# Patient Record
Sex: Female | Born: 1975 | Race: White | Hispanic: No | Smoking: Former smoker
Health system: Southern US, Community
[De-identification: ages and names within clinical notes are randomized; demographics above are authoritative.]

## PROBLEM LIST (undated history)

## (undated) DIAGNOSIS — F319 Bipolar disorder, unspecified: Secondary | ICD-10-CM

## (undated) DIAGNOSIS — N858 Other specified noninflammatory disorders of uterus: Secondary | ICD-10-CM

## (undated) DIAGNOSIS — F32A Depression, unspecified: Secondary | ICD-10-CM

## (undated) DIAGNOSIS — F329 Major depressive disorder, single episode, unspecified: Secondary | ICD-10-CM

## (undated) DIAGNOSIS — C50911 Malignant neoplasm of unspecified site of right female breast: Secondary | ICD-10-CM

## (undated) DIAGNOSIS — C8590 Non-Hodgkin lymphoma, unspecified, unspecified site: Secondary | ICD-10-CM

## (undated) DIAGNOSIS — M545 Low back pain, unspecified: Secondary | ICD-10-CM

## (undated) DIAGNOSIS — G8929 Other chronic pain: Secondary | ICD-10-CM

## (undated) DIAGNOSIS — F419 Anxiety disorder, unspecified: Secondary | ICD-10-CM

## (undated) DIAGNOSIS — K859 Acute pancreatitis without necrosis or infection, unspecified: Secondary | ICD-10-CM

## (undated) DIAGNOSIS — C50912 Malignant neoplasm of unspecified site of left female breast: Secondary | ICD-10-CM

## (undated) DIAGNOSIS — G43909 Migraine, unspecified, not intractable, without status migrainosus: Secondary | ICD-10-CM

## (undated) HISTORY — PX: BREAST BIOPSY: SHX20

## (undated) HISTORY — PX: MASTECTOMY PARTIAL / LUMPECTOMY: SUR851

## (undated) HISTORY — PX: BACK SURGERY: SHX140

## (undated) HISTORY — PX: KNEE ARTHROSCOPY: SHX127

---
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
## 1997-08-30 DIAGNOSIS — N858 Other specified noninflammatory disorders of uterus: Secondary | ICD-10-CM

## 1997-08-30 HISTORY — PX: LAPAROSCOPIC ASSISTED VAGINAL HYSTERECTOMY: SHX5398

## 1997-08-30 HISTORY — DX: Other specified noninflammatory disorders of uterus: N85.8

## 2000-08-30 HISTORY — PX: POSTERIOR LUMBAR FUSION: SHX6036

## 2011-08-31 HISTORY — PX: LAPAROSCOPIC CHOLECYSTECTOMY: SUR755

## 2013-08-30 DIAGNOSIS — C8590 Non-Hodgkin lymphoma, unspecified, unspecified site: Secondary | ICD-10-CM

## 2013-08-30 HISTORY — PX: PORTACATH PLACEMENT: SHX2246

## 2013-08-30 HISTORY — DX: Non-Hodgkin lymphoma, unspecified, unspecified site: C85.90

## 2016-08-17 ENCOUNTER — Emergency Department (HOSPITAL_COMMUNITY): Payer: Medicare Other

## 2016-08-17 ENCOUNTER — Encounter (HOSPITAL_COMMUNITY): Payer: Self-pay

## 2016-08-17 ENCOUNTER — Inpatient Hospital Stay (HOSPITAL_COMMUNITY)
Admission: EM | Admit: 2016-08-17 | Discharge: 2016-08-25 | DRG: 093 | Disposition: A | Discharge status: Skilled Nursing Facility | Payer: Medicare Other | Attending: Internal Medicine | Admitting: Internal Medicine

## 2016-08-17 DIAGNOSIS — R4182 Altered mental status, unspecified: Secondary | ICD-10-CM

## 2016-08-17 DIAGNOSIS — Z9221 Personal history of antineoplastic chemotherapy: Secondary | ICD-10-CM

## 2016-08-17 DIAGNOSIS — Z87891 Personal history of nicotine dependence: Secondary | ICD-10-CM

## 2016-08-17 DIAGNOSIS — D649 Anemia, unspecified: Secondary | ICD-10-CM | POA: Diagnosis not present

## 2016-08-17 DIAGNOSIS — Z808 Family history of malignant neoplasm of other organs or systems: Secondary | ICD-10-CM

## 2016-08-17 DIAGNOSIS — G9340 Encephalopathy, unspecified: Secondary | ICD-10-CM | POA: Diagnosis not present

## 2016-08-17 DIAGNOSIS — R109 Unspecified abdominal pain: Secondary | ICD-10-CM

## 2016-08-17 DIAGNOSIS — T502X5A Adverse effect of carbonic-anhydrase inhibitors, benzothiadiazides and other diuretics, initial encounter: Secondary | ICD-10-CM | POA: Diagnosis present

## 2016-08-17 DIAGNOSIS — K219 Gastro-esophageal reflux disease without esophagitis: Secondary | ICD-10-CM

## 2016-08-17 DIAGNOSIS — Z803 Family history of malignant neoplasm of breast: Secondary | ICD-10-CM

## 2016-08-17 DIAGNOSIS — G92 Toxic encephalopathy: Principal | ICD-10-CM | POA: Diagnosis present

## 2016-08-17 DIAGNOSIS — F319 Bipolar disorder, unspecified: Secondary | ICD-10-CM

## 2016-08-17 DIAGNOSIS — F419 Anxiety disorder, unspecified: Secondary | ICD-10-CM

## 2016-08-17 DIAGNOSIS — T428X5A Adverse effect of antiparkinsonism drugs and other central muscle-tone depressants, initial encounter: Secondary | ICD-10-CM | POA: Diagnosis present

## 2016-08-17 DIAGNOSIS — E876 Hypokalemia: Secondary | ICD-10-CM

## 2016-08-17 DIAGNOSIS — Y92811 Bus as the place of occurrence of the external cause: Secondary | ICD-10-CM

## 2016-08-17 DIAGNOSIS — D72829 Elevated white blood cell count, unspecified: Secondary | ICD-10-CM

## 2016-08-17 DIAGNOSIS — T450X5A Adverse effect of antiallergic and antiemetic drugs, initial encounter: Secondary | ICD-10-CM | POA: Diagnosis present

## 2016-08-17 DIAGNOSIS — F4321 Adjustment disorder with depressed mood: Secondary | ICD-10-CM | POA: Diagnosis present

## 2016-08-17 DIAGNOSIS — I10 Essential (primary) hypertension: Secondary | ICD-10-CM | POA: Diagnosis present

## 2016-08-17 DIAGNOSIS — R52 Pain, unspecified: Secondary | ICD-10-CM

## 2016-08-17 DIAGNOSIS — K5903 Drug induced constipation: Secondary | ICD-10-CM | POA: Diagnosis not present

## 2016-08-17 DIAGNOSIS — T426X5A Adverse effect of other antiepileptic and sedative-hypnotic drugs, initial encounter: Secondary | ICD-10-CM | POA: Diagnosis present

## 2016-08-17 DIAGNOSIS — D696 Thrombocytopenia, unspecified: Secondary | ICD-10-CM | POA: Diagnosis not present

## 2016-08-17 DIAGNOSIS — T402X5A Adverse effect of other opioids, initial encounter: Secondary | ICD-10-CM | POA: Diagnosis not present

## 2016-08-17 DIAGNOSIS — C8330 Diffuse large B-cell lymphoma, unspecified site: Secondary | ICD-10-CM

## 2016-08-17 DIAGNOSIS — Z8572 Personal history of non-Hodgkin lymphomas: Secondary | ICD-10-CM

## 2016-08-17 DIAGNOSIS — Z801 Family history of malignant neoplasm of trachea, bronchus and lung: Secondary | ICD-10-CM

## 2016-08-17 HISTORY — DX: Bipolar disorder, unspecified: F31.9

## 2016-08-17 HISTORY — DX: Depression, unspecified: F32.A

## 2016-08-17 HISTORY — DX: Other specified noninflammatory disorders of uterus: N85.8

## 2016-08-17 HISTORY — DX: Malignant neoplasm of unspecified site of left female breast: C50.912

## 2016-08-17 HISTORY — DX: Other chronic pain: G89.29

## 2016-08-17 HISTORY — DX: Low back pain: M54.5

## 2016-08-17 HISTORY — DX: Acute pancreatitis without necrosis or infection, unspecified: K85.90

## 2016-08-17 HISTORY — DX: Major depressive disorder, single episode, unspecified: F32.9

## 2016-08-17 HISTORY — DX: Malignant neoplasm of unspecified site of right female breast: C50.911

## 2016-08-17 HISTORY — DX: Non-Hodgkin lymphoma, unspecified, unspecified site: C85.90

## 2016-08-17 HISTORY — DX: Migraine, unspecified, not intractable, without status migrainosus: G43.909

## 2016-08-17 HISTORY — DX: Low back pain, unspecified: M54.50

## 2016-08-17 HISTORY — DX: Anxiety disorder, unspecified: F41.9

## 2016-08-17 LAB — COMPREHENSIVE METABOLIC PANEL
ALK PHOS: 70 U/L (ref 38–126)
ALT: 29 U/L (ref 14–54)
AST: 31 U/L (ref 15–41)
Albumin: 4.1 g/dL (ref 3.5–5.0)
Anion gap: 3 — ABNORMAL LOW (ref 5–15)
BILIRUBIN TOTAL: 0.5 mg/dL (ref 0.3–1.2)
BUN: 6 mg/dL (ref 6–20)
CALCIUM: 9.4 mg/dL (ref 8.9–10.3)
CO2: 29 mmol/L (ref 22–32)
CREATININE: 0.76 mg/dL (ref 0.44–1.00)
Chloride: 107 mmol/L (ref 101–111)
Glucose, Bld: 117 mg/dL — ABNORMAL HIGH (ref 65–99)
Potassium: 3.7 mmol/L (ref 3.5–5.1)
Sodium: 139 mmol/L (ref 135–145)
TOTAL PROTEIN: 7.6 g/dL (ref 6.5–8.1)

## 2016-08-17 LAB — CBC WITH DIFFERENTIAL/PLATELET
Basophils Absolute: 0 10*3/uL (ref 0.0–0.1)
Basophils Relative: 0 %
Eosinophils Absolute: 0.1 10*3/uL (ref 0.0–0.7)
Eosinophils Relative: 1 %
HEMATOCRIT: 38.6 % (ref 36.0–46.0)
HEMOGLOBIN: 12.9 g/dL (ref 12.0–15.0)
LYMPHS ABS: 2.3 10*3/uL (ref 0.7–4.0)
LYMPHS PCT: 28 %
MCH: 30.9 pg (ref 26.0–34.0)
MCHC: 33.4 g/dL (ref 30.0–36.0)
MCV: 92.3 fL (ref 78.0–100.0)
MONOS PCT: 8 %
Monocytes Absolute: 0.7 10*3/uL (ref 0.1–1.0)
NEUTROS PCT: 63 %
Neutro Abs: 5 10*3/uL (ref 1.7–7.7)
Platelets: 139 10*3/uL — ABNORMAL LOW (ref 150–400)
RBC: 4.18 MIL/uL (ref 3.87–5.11)
RDW: 14.4 % (ref 11.5–15.5)
WBC: 8.1 10*3/uL (ref 4.0–10.5)

## 2016-08-17 LAB — RAPID URINE DRUG SCREEN, HOSP PERFORMED
Amphetamines: NOT DETECTED
BARBITURATES: NOT DETECTED
Benzodiazepines: POSITIVE — AB
Cocaine: NOT DETECTED
OPIATES: NOT DETECTED
Tetrahydrocannabinol: NOT DETECTED

## 2016-08-17 LAB — ETHANOL
Alcohol, Ethyl (B): <5 mg/dL (ref <5)
Alcohol, Ethyl (B): <5 mg/dL (ref <5)

## 2016-08-17 LAB — URINALYSIS, ROUTINE W REFLEX MICROSCOPIC
Bilirubin Urine: NEGATIVE
Glucose, UA: NEGATIVE mg/dL
Hgb urine dipstick: NEGATIVE
KETONES UR: NEGATIVE mg/dL
LEUKOCYTES UA: NEGATIVE
NITRITE: NEGATIVE
PH: 7 (ref 5.0–8.0)
Protein, ur: NEGATIVE mg/dL
Specific Gravity, Urine: 1.014 (ref 1.005–1.030)

## 2016-08-17 LAB — ACETAMINOPHEN LEVEL: Acetaminophen (Tylenol), Serum: <10 ug/mL — ABNORMAL LOW (ref 10–30)

## 2016-08-17 LAB — FOLATE: FOLATE: 47.7 ng/mL (ref >5.9)

## 2016-08-17 LAB — SALICYLATE LEVEL: Salicylate Lvl: <7 mg/dL (ref 2.8–30.0)

## 2016-08-17 LAB — VITAMIN B12: Vitamin B-12: 751 pg/mL (ref 180–914)

## 2016-08-17 LAB — TSH: TSH: 3.917 u[IU]/mL (ref 0.350–4.500)

## 2016-08-17 MED ORDER — ADULT MULTIVITAMIN W/MINERALS CH
1.0000 | ORAL_TABLET | Freq: Every day | ORAL | Status: DC
  Administered 2016-08-18 – 2016-08-25 (×7): 1 via ORAL
  Filled 2016-08-17 (×9): qty 1

## 2016-08-17 MED ORDER — SODIUM CHLORIDE 0.9% FLUSH
3.0000 mL | Freq: Two times a day (BID) | INTRAVENOUS | Status: DC
  Administered 2016-08-17 – 2016-08-25 (×13): 3 mL via INTRAVENOUS

## 2016-08-17 MED ORDER — LORAZEPAM 2 MG/ML IJ SOLN: 1.0000 mg | INTRAMUSCULAR | Status: DC | PRN

## 2016-08-17 MED ORDER — LORAZEPAM 2 MG/ML IJ SOLN
1.0000 mg | Freq: Once | INTRAMUSCULAR | Status: AC
  Administered 2016-08-17: 1 mg via INTRAVENOUS
  Filled 2016-08-17: qty 1

## 2016-08-17 MED ORDER — SODIUM CHLORIDE 0.9 % IV SOLN
INTRAVENOUS | Status: DC
  Administered 2016-08-17: 23:00:00 via INTRAVENOUS

## 2016-08-17 MED ORDER — FOLIC ACID 5 MG/ML IJ SOLN
1.0000 mg | Freq: Every day | INTRAMUSCULAR | Status: DC
  Administered 2016-08-18 – 2016-08-20 (×4): 1 mg via INTRAVENOUS
  Filled 2016-08-17 (×5): qty 0.2

## 2016-08-17 MED ORDER — THIAMINE HCL 100 MG/ML IJ SOLN
100.0000 mg | Freq: Every day | INTRAMUSCULAR | Status: DC
  Administered 2016-08-17 – 2016-08-20 (×4): 100 mg via INTRAVENOUS
  Filled 2016-08-17 (×4): qty 2

## 2016-08-17 MED ORDER — SODIUM CHLORIDE 0.9 % IV BOLUS (SEPSIS)
1000.0000 mL | Freq: Once | INTRAVENOUS | Status: AC
  Administered 2016-08-17: 1000 mL via INTRAVENOUS

## 2016-08-17 MED ORDER — HEPARIN SODIUM (PORCINE) 5000 UNIT/ML IJ SOLN
5000.0000 [IU] | Freq: Three times a day (TID) | INTRAMUSCULAR | Status: DC
  Administered 2016-08-18: 5000 [IU] via SUBCUTANEOUS
  Filled 2016-08-17 (×10): qty 1

## 2016-08-17 NOTE — ED Provider Notes (Signed)
[facility] Provider Note   CSN: [id number] Arrival date & time: 08/17/16  1253     History   Chief Complaint Chief Complaint  Patient presents with  . Altered Mental Status    HPI Veronica Fitzgerald is a 40 y.o. female.  Level V caveat secondary to altered mental status.   Patient arrived via EMS secondary to altered mental status after a bus ride. She was on her way here from [place]. Apparently had been seen last 2 days in the emergency room for vomiting. She received prescriptions for Zofran. These have not been filled as the prescriptions are still in her bag. She also has empty bottles of Zanaflex, hydrochlorothiazide and Phenergan these were filled in the spring and summer so unsure if any ingestion.     Altered Mental Status   This is a new problem. The current episode started less than 1 hour ago. The problem has not changed since onset.Associated symptoms include confusion. Her past medical history does not include seizures or COPD.    History reviewed. No pertinent past medical history.  Patient Active Problem List   Diagnosis Date Noted  . Encephalopathy 08/17/2016    History reviewed. No pertinent surgical history.  OB History    No data available       Home Medications    Prior to Admission medications   Medication Sig Start Date End Date Taking? Authorizing Provider  acetaminophen (TYLENOL) 500 MG tablet Take 2,000 mg by mouth every 6 (six) hours as needed for mild pain.   Yes Historical Provider, MD  albuterol (PROVENTIL HFA;VENTOLIN HFA) 108 (90 Base) MCG/ACT inhaler Inhale 2 puffs into the lungs every 6 (six) hours as needed for wheezing or shortness of breath.   Yes Historical Provider, MD  EPINEPHrine (EPIPEN 2-PAK) 0.3 mg/0.3 mL IJ SOAJ injection Inject 0.3 mg into the muscle daily as needed. For allergic reaction   Yes Historical Provider, MD  hydrochlorothiazide (HYDRODIURIL) 25 MG tablet Take 25 mg by mouth daily.   Yes Historical  Provider, MD  ibuprofen (ADVIL,MOTRIN) 200 MG tablet Take 400 mg by mouth every 6 (six) hours as needed for moderate pain.   Yes Historical Provider, MD  promethazine (PHENERGAN) 25 MG tablet Take 25 mg by mouth daily before lunch.   Yes Historical Provider, MD  SUMAtriptan (IMITREX) 100 MG tablet Take 100 mg by mouth every 2 (two) hours as needed for migraine. May repeat in 2 hours if headache persists or recurs.   Yes Historical Provider, MD  tiZANidine (ZANAFLEX) 4 MG tablet Take 4 mg by mouth every 6 (six) hours as needed for muscle spasms.   Yes Historical Provider, MD  zolpidem (AMBIEN CR) 12.5 MG CR tablet Take 12.5 mg by mouth at bedtime as needed for sleep.   Yes Historical Provider, MD    Family History No family history on file.  Social History Social History  Substance Use Topics  . Smoking status: Former Smoker    Types: Cigarettes  . Smokeless tobacco: Never Used  . Alcohol use Not on file     Allergies   Patient has no known allergies.   Review of Systems Review of Systems  Unable to perform ROS: Mental status change  Psychiatric/Behavioral: Positive for confusion.     Physical Exam Updated Vital Signs BP 100/67 (BP Location: Right Arm)   Pulse 85   Temp 98.6 F (37 C) (Oral)   Resp 18   Wt 154 lb 1.6 oz (69.9 kg)  SpO2 100%   Physical Exam  Constitutional: She appears well-developed and well-nourished.  HENT:  Head: Normocephalic and atraumatic.  Mouth/Throat: Mucous membranes are dry.  Eyes: Conjunctivae and EOM are normal.  Neck: Normal range of motion.  Cardiovascular: Normal rate and regular rhythm.   Pulmonary/Chest: No stridor. No respiratory distress.  Abdominal: Soft. She exhibits no distension.  Musculoskeletal: Normal range of motion. She exhibits no edema or deformity.  Neurological: She is alert. No cranial nerve deficit. Coordination normal.  Picking at the air, not responding to questions, mumbles some stuff once in awhile but  overall speech is incomprehensible  Skin: Skin is warm and dry. No erythema.  Nursing note and vitals reviewed.    ED Treatments / Results  Labs (all labs ordered are listed, but only abnormal results are displayed) Labs Reviewed  CBC WITH DIFFERENTIAL/PLATELET - Abnormal; Notable for the following:       Result Value   Platelets 139 (*)    All other components within normal limits  COMPREHENSIVE METABOLIC PANEL - Abnormal; Notable for the following:    Glucose, Bld 117 (*)    Anion gap 3 (*)    All other components within normal limits  RAPID URINE DRUG SCREEN, HOSP PERFORMED - Abnormal; Notable for the following:    Benzodiazepines POSITIVE (*)    All other components within normal limits  ACETAMINOPHEN LEVEL - Abnormal; Notable for the following:    Acetaminophen (Tylenol), Serum <10 (*)    All other components within normal limits  BASIC METABOLIC PANEL - Abnormal; Notable for the following:    Glucose, Bld 103 (*)    BUN <5 (*)    All other components within normal limits  ETHANOL  URINALYSIS, ROUTINE W REFLEX MICROSCOPIC  ETHANOL  TSH  VITAMIN [id number]  FOLATE  SALICYLATE LEVEL  CBC  POC URINE PREG, ED    EKG  EKG Interpretation  Date/Time:  Tuesday August 17 2016 13:21:30 EST Ventricular Rate:  102 PR Interval:    QRS Duration: 84 QT Interval:  378 QTC Calculation: 493 R Axis:   52 Text Interpretation:  Sinus tachycardia Probable left atrial enlargement Borderline prolonged QT interval Baseline wander in lead(s) II III aVF V1 V2 No old tracing to compare Confirmed by [facility] MD, Corene Cornea [phone]) on 08/17/2016 2:04:11 PM       Radiology Ct Head Wo Contrast  Result Date: 08/17/2016 CLINICAL DATA:  Altered mental status EXAM: CT HEAD WITHOUT CONTRAST TECHNIQUE: Contiguous axial images were obtained from the base of the skull through the vertex without intravenous contrast. COMPARISON:  None. FINDINGS: Brain: No evidence of acute infarction, hemorrhage,  hydrocephalus, extra-axial collection or mass lesion/mass effect. Vascular: No hyperdense vessel or unexpected calcification. Skull: Negative Sinuses/Orbits: Negative Other: None IMPRESSION: Normal CT head Electronically Signed   By: Franchot Gallo M.D.   On: 08/17/2016 16:43    Procedures Procedures (including critical care time)  Medications Ordered in ED Medications  heparin injection 5,000 Units (5,000 Units Subcutaneous Given 08/18/16 0653)  sodium chloride flush (NS) 0.9 % injection 3 mL (3 mLs Intravenous Given 08/17/16 2300)  0.9 %  sodium chloride infusion ( Intravenous New Bag/Given 08/17/16 2244)  thiamine (B-1) injection 100 mg (100 mg Intravenous Given 08/17/16 2243)  multivitamin with minerals tablet 1 tablet (1 tablet Oral Not Given [id number])  folic acid injection 1 mg (1 mg Intravenous Given 08/18/16 0146)  LORazepam (ATIVAN) injection 1 mg (not administered)  ondansetron (ZOFRAN) injection 4 mg (4 mg  Intravenous Given 08/18/16 0653)  sodium chloride 0.9 % bolus 1,000 mL (0 mLs Intravenous Stopped 08/17/16 1527)  LORazepam (ATIVAN) injection 1 mg (1 mg Intravenous Given 08/17/16 1524)  LORazepam (ATIVAN) injection 1 mg (1 mg Intravenous Given 08/17/16 1610)     Initial Impression / Assessment and Plan / ED Course  I have reviewed the triage vital signs and the nursing notes.  Pertinent labs & imaging results that were available during my care of the patient were reviewed by me and considered in my medical decision making (see chart for details).  Clinical Course    Seems toxic. Doubt infectious causes. Will workup appropriately, CT scan/labs/ecg, etc. Attempting to get records from [facility]. Will d/w poison control.  Discussed with poison control and it could be Anticholinergic getting worse or possibly better, should keep monitoring.  CT negative on my evaluation but will await radiology evaluation.  Considered LP, but no fever, leukocytosis to suggest  meningitis/encephalitis. D/W Dr. Waldron Labs with [facility] and will admit to tele for monitoring and further workup as indicated.   Final Clinical Impressions(s) / ED Diagnoses   Final diagnoses:  Altered mental status, unspecified altered mental status type    New Prescriptions Current Discharge Medication List       Merrily Pew, MD 08/18/16 [phone]

## 2016-08-17 NOTE — ED Notes (Signed)
Patient remained confused and combative. Is reaching at things that are not there and grabbing staff members.

## 2016-08-17 NOTE — ED Notes (Signed)
Patient is extremely confused and combative.  Unable to obtain history or complete triage due to lack of previous medical history and patients current mental status.

## 2016-08-17 NOTE — ED Notes (Signed)
Patient is more relaxed currently. Still not following commands, but is less combative.

## 2016-08-17 NOTE — ED Notes (Signed)
Patient is too confused/combative to have CT scan currently.

## 2016-08-17 NOTE — H&P (Signed)
[facility] H&P   Patient Demographics:    Veronica Fitzgerald, is a 40 y.o. female  MRN: [id number]   DOB - Jan 26, 1976  Admit Date - 08/17/2016  Outpatient Primary MD for the patient is No primary care provider on file.  Referring MD/NP/PA: Dr Dayna Barker  Patient coming from: [place] station  Chief Complaint  Patient presents with  . Altered Mental Status      HPI:    Veronica Fitzgerald  is a 40 y.o. female, Who presents via EMS secondary to altered mental status from a bus ride, she was on her from [place], as [place] she was noticed to be confused and combative, where EMS was called, patient is encephalopathic, unable to provide any review of system, there is no information about any family members, so all records and information were obtained from ED department , From records at her purse, appears she was in [place] ED 12/17 for vomiting, and at [facility] 12/[facility] as well, and she presents to [facility] ED 12/19, she had 3 empty bottles of Zanaflex, hydrochlorothiazide and Phenergan filled in the summer, as well she had Benadryl in her purse, she can't give any history of any previous psychiatric history, records are pending from [place] and recommended hospital, cannot provide any family information, her cell phone is not working as well, seemed IV fluid, mild improvement with mental status with Ativan in ED, hospitalist was called to admit.    Review of systems:    Unable to provide Review of system given her encephalopathy   With Past History of the following :    History reviewed. No pertinent past medical history.    History reviewed. No pertinent surgical history.    Social History:     Social History  Substance Use Topics  . Smoking status: Not on file  . Smokeless tobacco: Not on file  . Alcohol use Not on file     Lives - Unknown  Mobility - unknown     Family History :    Tried to obtain and was unable   Home Medications:   Prior to Admission medications   Not on File     Allergies:    Allergies not on file   Physical Exam:   Vitals  Blood pressure 147/98, pulse 107, temperature 98.7 F (37.1 C), temperature source Oral, resp. rate 17, SpO2 100 %.   1. General Confused, awake lying in bed in NAD,    2. Recent extremely confused, unable to follow commands, unable to answer any questions  3. No F.N deficits, ALL C.Nerves Intact, no focal deficits could be identified .  4. Ears and Eyes appear Normal, Conjunctivae clear, PERRLA. Moist Oral Mucosa.  5. Supple Neck, No JVD, No cervical lymphadenopathy appriciated, No Carotid Bruits.  6. Symmetrical Chest wall movement, Good air movement bilaterally, CTAB.  7. RRR, No Gallops, Rubs or Murmurs,  No Parasternal Heave.  8. Positive Bowel Sounds, Abdomen Soft, No tenderness, No organomegaly appriciated,No rebound -guarding or rigidity.  9.  No Cyanosis, Normal Skin Turgor, No Skin Rash or Bruise.  10. Good muscle tone,  joints appear normal , no effusions, Normal ROM.  11. No Palpable Lymph Nodes in Neck or Axillae     Data Review:    CBC  Recent Labs Lab 08/17/16 1316  WBC 8.1  HGB 12.9  HCT 38.6  PLT 139*  MCV 92.3  MCH 30.9  MCHC 33.4  RDW 14.4  LYMPHSABS 2.3  MONOABS 0.7  EOSABS 0.1  BASOSABS 0.0   ------------------------------------------------------------------------------------------------------------------  Chemistries   Recent Labs Lab 08/17/16 1316  NA 139  K 3.7  CL 107  CO2 29  GLUCOSE 117*  BUN 6  CREATININE 0.76  CALCIUM 9.4  AST 31  ALT 29  ALKPHOS 70  BILITOT 0.5   ------------------------------------------------------------------------------------------------------------------ CrCl cannot be calculated (Unknown ideal  weight.). ------------------------------------------------------------------------------------------------------------------ No results for input(s): TSH, T4TOTAL, T3FREE, THYROIDAB in the last 72 hours.  Invalid input(s): FREET3  Coagulation profile No results for input(s): INR, PROTIME in the last 168 hours. ------------------------------------------------------------------------------------------------------------------- No results for input(s): DDIMER in the last 72 hours. -------------------------------------------------------------------------------------------------------------------  Cardiac Enzymes No results for input(s): CKMB, TROPONINI, MYOGLOBIN in the last 168 hours.  Invalid input(s): CK ------------------------------------------------------------------------------------------------------------------ No results found for: BNP   ---------------------------------------------------------------------------------------------------------------  Urinalysis    Component Value Date/Time   COLORURINE YELLOW 08/17/2016 [place] 08/17/2016 1337   LABSPEC 1.014 08/17/2016 1337   PHURINE 7.0 08/17/2016 1337   GLUCOSEU NEGATIVE 08/17/2016 1337   HGBUR NEGATIVE 08/17/2016 1337   [place] 08/17/2016 1337   KETONESUR NEGATIVE 08/17/2016 1337   PROTEINUR NEGATIVE 08/17/2016 1337   NITRITE NEGATIVE 08/17/2016 1337   LEUKOCYTESUR NEGATIVE 08/17/2016 1337    ----------------------------------------------------------------------------------------------------------------   Imaging Results:    Ct Head Wo Contrast  Result Date: 08/17/2016 CLINICAL DATA:  Altered mental status EXAM: CT HEAD WITHOUT CONTRAST TECHNIQUE: Contiguous axial images were obtained from the base of the skull through the vertex without intravenous contrast. COMPARISON:  None. FINDINGS: Brain: No evidence of acute infarction, hemorrhage, hydrocephalus, extra-axial collection or mass  lesion/mass effect. Vascular: No hyperdense vessel or unexpected calcification. Skull: Negative Sinuses/Orbits: Negative Other: None IMPRESSION: Normal CT head Electronically Signed   By: Franchot Gallo M.D.   On: 08/17/2016 16:43    My personal review of EKG: Rhythm NSR, Rate  102 /min, QTc 493 , no Acute ST changes   Assessment & Plan:    Active Problems:   Encephalopathy    Acute encephalopathy - Unclear etiology, unclear baseline, patient first time visit to [facility] with no previous history, been addicted before yesterday, at [facility] yesterday, today she presents to [facility], awaiting records from other facilities. - Possibly related to medication, as she's been having  Tizanidine, Benadryl, Phenergan bottles with her, improvement of mental status after Ativan, CT head with no acute findings, swimming but admit her to observation on telemetry unit, continue with IV fluids, keep nothing by mouth, will check acetaminophen, salicylate, TSH, 0000000 and folic acid level, was started on IV thiamine, folic acid and multivitamin, and when necessary Ativan. - Unlikely infectious etiology given no leukocytosis, afebrile, nontoxic appearing. - Unclear if it psychiatric history, there is no contact information for any family members.    DVT Prophylaxis Heparin -   SCDs  AM Labs Ordered, also please review Full Orders  Family Communication:  Admission, patients condition and plan of care including tests being ordered Code Status Full  Likely DC to  Home  Condition GUARDED    Consults called: none  Admission status: observation   Time spent in minutes : 55 minutes   Noma Quijas M.D on 08/17/2016 at 5:32 PM  Between 7am to 7pm - Pager - [phone]. After 7pm go to [email or web address] - password [facility]  [facility] - Office  [phone]

## 2016-08-17 NOTE — ED Triage Notes (Signed)
Patient transported via [facility]. Was found altered/extremely lethargic at the end of a [place] bus trip from [place].  Patient fell once while on the bus awaiting the arrival of EMS. EMS reports the patient is not oriented and was combative. Responds incoherently to voice. 3 empty pill bottles were found with the patient and her belongings. VS - BP: 130/88, HR:110,SpO2: 98,CBG 92.

## 2016-08-18 ENCOUNTER — Observation Stay (HOSPITAL_COMMUNITY): Payer: Medicare Other

## 2016-08-18 ENCOUNTER — Encounter (HOSPITAL_COMMUNITY): Payer: Self-pay

## 2016-08-18 DIAGNOSIS — T428X5A Adverse effect of antiparkinsonism drugs and other central muscle-tone depressants, initial encounter: Secondary | ICD-10-CM | POA: Diagnosis present

## 2016-08-18 DIAGNOSIS — R4182 Altered mental status, unspecified: Secondary | ICD-10-CM | POA: Diagnosis present

## 2016-08-18 DIAGNOSIS — T402X5A Adverse effect of other opioids, initial encounter: Secondary | ICD-10-CM | POA: Diagnosis not present

## 2016-08-18 DIAGNOSIS — F419 Anxiety disorder, unspecified: Secondary | ICD-10-CM | POA: Diagnosis not present

## 2016-08-18 DIAGNOSIS — Z808 Family history of malignant neoplasm of other organs or systems: Secondary | ICD-10-CM | POA: Diagnosis not present

## 2016-08-18 DIAGNOSIS — Z9221 Personal history of antineoplastic chemotherapy: Secondary | ICD-10-CM | POA: Diagnosis not present

## 2016-08-18 DIAGNOSIS — E876 Hypokalemia: Secondary | ICD-10-CM | POA: Diagnosis not present

## 2016-08-18 DIAGNOSIS — G92 Toxic encephalopathy: Secondary | ICD-10-CM | POA: Diagnosis present

## 2016-08-18 DIAGNOSIS — T450X5A Adverse effect of antiallergic and antiemetic drugs, initial encounter: Secondary | ICD-10-CM | POA: Diagnosis present

## 2016-08-18 DIAGNOSIS — F4321 Adjustment disorder with depressed mood: Secondary | ICD-10-CM | POA: Diagnosis present

## 2016-08-18 DIAGNOSIS — Z8572 Personal history of non-Hodgkin lymphomas: Secondary | ICD-10-CM | POA: Diagnosis not present

## 2016-08-18 DIAGNOSIS — D696 Thrombocytopenia, unspecified: Secondary | ICD-10-CM | POA: Diagnosis not present

## 2016-08-18 DIAGNOSIS — T426X5A Adverse effect of other antiepileptic and sedative-hypnotic drugs, initial encounter: Secondary | ICD-10-CM | POA: Diagnosis present

## 2016-08-18 DIAGNOSIS — G9340 Encephalopathy, unspecified: Secondary | ICD-10-CM | POA: Diagnosis not present

## 2016-08-18 DIAGNOSIS — R1084 Generalized abdominal pain: Secondary | ICD-10-CM | POA: Diagnosis not present

## 2016-08-18 DIAGNOSIS — T502X5A Adverse effect of carbonic-anhydrase inhibitors, benzothiadiazides and other diuretics, initial encounter: Secondary | ICD-10-CM | POA: Diagnosis present

## 2016-08-18 DIAGNOSIS — D649 Anemia, unspecified: Secondary | ICD-10-CM | POA: Diagnosis not present

## 2016-08-18 DIAGNOSIS — K219 Gastro-esophageal reflux disease without esophagitis: Secondary | ICD-10-CM | POA: Diagnosis present

## 2016-08-18 DIAGNOSIS — R109 Unspecified abdominal pain: Secondary | ICD-10-CM | POA: Diagnosis not present

## 2016-08-18 DIAGNOSIS — Y92811 Bus as the place of occurrence of the external cause: Secondary | ICD-10-CM | POA: Diagnosis not present

## 2016-08-18 DIAGNOSIS — K5903 Drug induced constipation: Secondary | ICD-10-CM | POA: Diagnosis not present

## 2016-08-18 DIAGNOSIS — F319 Bipolar disorder, unspecified: Secondary | ICD-10-CM | POA: Diagnosis present

## 2016-08-18 DIAGNOSIS — Z801 Family history of malignant neoplasm of trachea, bronchus and lung: Secondary | ICD-10-CM | POA: Diagnosis not present

## 2016-08-18 DIAGNOSIS — Z803 Family history of malignant neoplasm of breast: Secondary | ICD-10-CM | POA: Diagnosis not present

## 2016-08-18 DIAGNOSIS — Z87891 Personal history of nicotine dependence: Secondary | ICD-10-CM | POA: Diagnosis not present

## 2016-08-18 DIAGNOSIS — I10 Essential (primary) hypertension: Secondary | ICD-10-CM | POA: Diagnosis present

## 2016-08-18 DIAGNOSIS — C8330 Diffuse large B-cell lymphoma, unspecified site: Secondary | ICD-10-CM | POA: Diagnosis not present

## 2016-08-18 LAB — CBC
HEMATOCRIT: 38.9 % (ref 36.0–46.0)
HEMOGLOBIN: 13 g/dL (ref 12.0–15.0)
MCH: 30.3 pg (ref 26.0–34.0)
MCHC: 33.4 g/dL (ref 30.0–36.0)
MCV: 90.7 fL (ref 78.0–100.0)
PLATELETS: 152 10*3/uL (ref 150–400)
RBC: 4.29 MIL/uL (ref 3.87–5.11)
RDW: 14.2 % (ref 11.5–15.5)
WBC: 5.9 10*3/uL (ref 4.0–10.5)

## 2016-08-18 LAB — BASIC METABOLIC PANEL
ANION GAP: 7 (ref 5–15)
BUN: <5 mg/dL — ABNORMAL LOW (ref 6–20)
CHLORIDE: 106 mmol/L (ref 101–111)
CO2: 24 mmol/L (ref 22–32)
CREATININE: 0.74 mg/dL (ref 0.44–1.00)
Calcium: 8.9 mg/dL (ref 8.9–10.3)
GFR calc non Af Amer: >60 mL/min (ref >60)
Glucose, Bld: 103 mg/dL — ABNORMAL HIGH (ref 65–99)
POTASSIUM: 3.5 mmol/L (ref 3.5–5.1)
SODIUM: 137 mmol/L (ref 135–145)

## 2016-08-18 LAB — LIPASE, BLOOD: Lipase: 14 U/L (ref 11–51)

## 2016-08-18 LAB — POCT PREGNANCY, URINE: PREG TEST UR: NEGATIVE

## 2016-08-18 MED ORDER — ONDANSETRON HCL 4 MG/2ML IJ SOLN
4.0000 mg | Freq: Four times a day (QID) | INTRAMUSCULAR | Status: DC | PRN
  Administered 2016-08-18 – 2016-08-25 (×19): 4 mg via INTRAVENOUS
  Filled 2016-08-18 (×20): qty 2

## 2016-08-18 MED ORDER — SENNOSIDES-DOCUSATE SODIUM 8.6-50 MG PO TABS
1.0000 | ORAL_TABLET | Freq: Two times a day (BID) | ORAL | Status: DC
  Administered 2016-08-18 – 2016-08-25 (×13): 1 via ORAL
  Filled 2016-08-18 (×15): qty 1

## 2016-08-18 MED ORDER — OXYCODONE HCL 5 MG PO TABS
5.0000 mg | ORAL_TABLET | Freq: Four times a day (QID) | ORAL | Status: DC | PRN
  Administered 2016-08-18 – 2016-08-19 (×3): 5 mg via ORAL
  Filled 2016-08-18 (×4): qty 1

## 2016-08-18 NOTE — Progress Notes (Signed)
PROGRESS NOTE    Veronica Fitzgerald  [id number] DOB: Oct 23, 1975 DOA: 08/17/2016 PCP: No primary care provider on file.   Outpatient Specialists:     Brief Narrative:  Veronica Fitzgerald  is a 40 y.o. female, Who presents via EMS secondary to altered mental status from a bus ride, she was on her from [place], as [place] she was noticed to be confused and combative, where EMS was called, patient is encephalopathic, unable to provide any review of system, there is no information about any family members, so all records and information were obtained from ED department , From records at her purse, appears she was in [place] ED 12/17 for vomiting, and at [facility] 12/18 as well, and she presents to [facility] ED 12/19, she had 3 empty bottles of Zanaflex, hydrochlorothiazide and Phenergan filled in the summer, as well she had Benadryl in her purse, she can't give any history of any previous psychiatric history, records are pending from [place] and recommended hospital, cannot provide any family information, her cell phone is not working as well, seemed IV fluid, mild improvement with mental status with Ativan in ED, hospitalist was called to admit.    Assessment & Plan:   Active Problems:   Encephalopathy   Acute encephalopathy - Possibly related to medication, as she's been taking-  Tizanidine, Benadryl, Phenergan, per ER records improvement of mental status after Ativan, CT head with no acute findings, -EEG ordered - Unlikely infectious etiology given no leukocytosis, afebrile, nontoxic appearing. - Unclear if it psychiatric history, there is no contact information for any family members-- sent request to PCP in [place] for medical hisotry -asking for IV pain medications-- will avoid -sent queries to all hospitals patient says she has visited-- [facility], [facility], [facility] and no records found for inpatient status  PT eval ordered for safety  DVT prophylaxis:  SCD's  Code Status: Full  Code   Family Communication:   Disposition Plan:        Subjective: C/o abdominal pain- and asking for "liquid" pain meds in her IV - lipase negative  Objective: Vitals:   08/17/16 1738 08/17/16 1745 08/17/16 2054 08/18/16 0612  BP: 100/92 115/89 115/83 100/67  Pulse: 103  94 85  Resp: 19 18 16 18   Temp:   98.8 F (37.1 C) 98.6 F (37 C)  TempSrc:   Oral Oral  SpO2: 96%  100% 100%  Weight:   69.9 kg (154 lb 1.6 oz)     Intake/Output Summary (Last 24 hours) at 08/18/16 1152 Last data filed at 08/18/16 0400  Gross per 24 hour  Intake             1900 ml  Output                0 ml  Net             1900 ml   Filed Weights   08/17/16 2054  Weight: 69.9 kg (154 lb 1.6 oz)    Examination:  General exam: Appears calm and comfortable  Respiratory system: Clear to auscultation. Respiratory effort normal. Cardiovascular system: S1 & S2 heard, RRR. No JVD, murmurs, rubs, gallops or clicks. No pedal edema. Gastrointestinal system: Abdomen is nondistended, soft and nontender. No organomegaly or masses felt. Normal bowel sounds heard. Central nervous system: Alert and oriented. No focal neurological deficits.     Data Reviewed: I have personally reviewed following labs and imaging studies  CBC:  Recent Labs Lab  08/17/16 1316 08/18/16 0539  WBC 8.1 5.9  NEUTROABS 5.0  --   HGB 12.9 13.0  HCT 38.6 38.9  MCV 92.3 90.7  PLT 139* 0000000   Basic Metabolic Panel:  Recent Labs Lab 08/17/16 1316 08/18/16 0539  NA 139 137  K 3.7 3.5  CL 107 106  CO2 29 24  GLUCOSE 117* 103*  BUN 6 <5*  CREATININE 0.76 0.74  CALCIUM 9.4 8.9   GFR: CrCl cannot be calculated (Unknown ideal weight.). Liver Function Tests:  Recent Labs Lab 08/17/16 1316  AST 31  ALT 29  ALKPHOS 70  BILITOT 0.5  PROT 7.6  ALBUMIN 4.1    Recent Labs Lab 08/18/16 0853  LIPASE 14   No results for input(s): AMMONIA in the last 168 hours. Coagulation Profile: No results for input(s):  INR, PROTIME in the last 168 hours. Cardiac Enzymes: No results for input(s): CKTOTAL, CKMB, CKMBINDEX, TROPONINI in the last 168 hours. BNP (last 3 results) No results for input(s): PROBNP in the last 8760 hours. HbA1C: No results for input(s): HGBA1C in the last 72 hours. CBG: No results for input(s): GLUCAP in the last 168 hours. Lipid Profile: No results for input(s): CHOL, HDL, LDLCALC, TRIG, CHOLHDL, LDLDIRECT in the last 72 hours. Thyroid Function Tests:  Recent Labs  08/17/16 2050  TSH 3.917   Anemia Panel:  Recent Labs  08/17/16 2050  VITAMINB12 751  FOLATE 47.7   Urine analysis:    Component Value Date/Time   COLORURINE YELLOW 08/17/2016 [place] 08/17/2016 1337   LABSPEC 1.014 08/17/2016 1337   PHURINE 7.0 08/17/2016 1337   GLUCOSEU NEGATIVE 08/17/2016 1337   HGBUR NEGATIVE 08/17/2016 1337   BILIRUBINUR NEGATIVE 08/17/2016 1337   [place] 08/17/2016 1337   PROTEINUR NEGATIVE 08/17/2016 1337   NITRITE NEGATIVE 08/17/2016 1337   LEUKOCYTESUR NEGATIVE 08/17/2016 1337     )No results found for this or any previous visit (from the past 240 hour(s)).    Anti-infectives    None       Radiology Studies: Ct Head Wo Contrast  Result Date: 08/17/2016 CLINICAL DATA:  Altered mental status EXAM: CT HEAD WITHOUT CONTRAST TECHNIQUE: Contiguous axial images were obtained from the base of the skull through the vertex without intravenous contrast. COMPARISON:  None. FINDINGS: Brain: No evidence of acute infarction, hemorrhage, hydrocephalus, extra-axial collection or mass lesion/mass effect. Vascular: No hyperdense vessel or unexpected calcification. Skull: Negative Sinuses/Orbits: Negative Other: None IMPRESSION: Normal CT head Electronically Signed   By: Franchot Gallo M.D.   On: 08/17/2016 16:43        Scheduled Meds: . folic acid  1 mg Intravenous Daily  . heparin  5,000 Units Subcutaneous Q8H  . multivitamin with minerals  1  tablet Oral Daily  . sodium chloride flush  3 mL Intravenous Q12H  . thiamine  100 mg Intravenous Daily   Continuous Infusions: . sodium chloride 75 mL/hr at 08/17/16 2244     LOS: 0 days    Time spent: 80 min    [place], DO [facility] Pager [phone]  If 7PM-7AM, please contact night-coverage [email or web address] Password [email or web address] 08/18/2016, 11:52 AM

## 2016-08-18 NOTE — Plan of Care (Signed)
Pt woke up, AOx4, stated she doesn't remember what happened to her. C/o nausea and abd pain. Dr. Baltazar Najjar paged. Awaiting for return call.

## 2016-08-18 NOTE — Progress Notes (Signed)
Nutrition Brief Note  Patient identified on the Malnutrition Screening Tool (MST) Report  Wt Readings from Last 15 Encounters:  08/17/16 154 lb 1.6 oz (69.9 kg)    Veronica Fitzgerald  is a 40 y.o. female, Who presents via EMS secondary to altered mental status from a bus ride, she was on her from [place], as [place] she was noticed to be confused and combative, where EMS was called, patient is encephalopathic, unable to provide any review of system, there is no information about any family members, so all records and information were obtained from ED department , From records at her purse, appears she was in [place] ED 12/17 for [facility], and [facility] as [facility], and she presents to [facility] ED 12/19, she had 3 empty bottles of Zanaflex, hydrochlorothiazide and Phenergan filled in the summer, as well she had Benadryl in her purse, she can't give any history of any previous psychiatric history, records are pending from [place] and recommended hospital, cannot provide any family information, her cell phone is not working as well, seemed IV fluid, mild improvement with mental status with Ativan in ED, hospitalist was called to admit.  Pt in with x-ray tech at time of visit. No signs of fat or muscle depletion observed. Pt was just advanced to a clear liquid diet prior to RD visit.   There is no height or weight on file to calculate BMI.   Current diet order is clear liquid, patient is consuming approximately n/a% of meals at this time. Labs and medications reviewed.   No nutrition interventions warranted at this time. If nutrition issues arise, please consult RD.   Barack Nicodemus A. Jimmye Norman, RD, LDN, CDE Pager: [phone] After hours Pager: [phone]

## 2016-08-18 NOTE — Progress Notes (Signed)
Routine EEG completed, results pending. 

## 2016-08-18 NOTE — Plan of Care (Signed)
Pt arrived from ED to 5W. Difficult to arouse, confused, combative at times and not cooperative with care. VSS, no s/s of any kind of distress. NST on tele. Refuses to answer any questions. IV fluids initiated @ 75. Pt is NPO. Fall precautions maintained. Pt slept throughout this shift. No attempts to get out of bed.

## 2016-08-18 NOTE — Procedures (Signed)
ELECTROENCEPHALOGRAM REPORT  Date of Study: 08/18/2016  Patient's Name: Veronica Fitzgerald MRN: [id number] Date of Birth: [birth year]  Referring Provider: Tomi Bamberger. Eliseo Squires, DO  Clinical History: 40 year old female with altered mental status.  Had possession Benadryl and empty bottle of tizanidine  Medications: Ativan Zofran Thiamine Oxycodone  Technical Summary: A multichannel digital EEG recording measured by the international 10-20 system with electrodes applied with paste and impedances below 5000 ohms performed in our laboratory with EKG monitoring in an awake and drowsy patient.  Hyperventilation and photic stimulation were not performed.  The digital EEG was referentially recorded, reformatted, and digitally filtered in a variety of bipolar and referential montages for optimal display.    Description: The patient is awake and drowsy during the recording.  During maximal wakefulness, there is a symmetric, medium voltage 8 Hz posterior dominant rhythm that attenuates with eye opening.  The record is symmetric.  There is an excess amount of diffuse low voltage beta activity seen throughout the recording. During drowsiness and sleep, there is an increase in theta slowing of the background. Stage 2 sleep was not seen.  There were no epileptiform discharges or electrographic seizures seen.    EKG lead was unremarkable.  Impression: This awake and drowsy EEG is normal except for excess amount of diffuse low voltage beta activity.  Clinical Correlation: Diffuse low voltage beta activity is commonly seen with sedating medications such as benzodiazepines.  In the absence of sedating medications, anxiety and hyperthyroidism may produce generalized beta activity.  The absence of epileptiform discharges does not exclude a clinical diagnosis of epilepsy.  If further clinical questions remain, prolonged EEG may be helpful.  Clinical correlation is advised.  Metta Clines, DO

## 2016-08-19 ENCOUNTER — Inpatient Hospital Stay (HOSPITAL_COMMUNITY): Payer: Medicare Other

## 2016-08-19 DIAGNOSIS — D72829 Elevated white blood cell count, unspecified: Secondary | ICD-10-CM

## 2016-08-19 DIAGNOSIS — R1084 Generalized abdominal pain: Secondary | ICD-10-CM

## 2016-08-19 DIAGNOSIS — E876 Hypokalemia: Secondary | ICD-10-CM

## 2016-08-19 DIAGNOSIS — K219 Gastro-esophageal reflux disease without esophagitis: Secondary | ICD-10-CM

## 2016-08-19 DIAGNOSIS — R109 Unspecified abdominal pain: Secondary | ICD-10-CM

## 2016-08-19 DIAGNOSIS — F319 Bipolar disorder, unspecified: Secondary | ICD-10-CM

## 2016-08-19 DIAGNOSIS — C8330 Diffuse large B-cell lymphoma, unspecified site: Secondary | ICD-10-CM

## 2016-08-19 LAB — CBC WITH DIFFERENTIAL/PLATELET
BASOS ABS: 0 10*3/uL (ref 0.0–0.1)
BASOS PCT: 0 %
EOS ABS: 0 10*3/uL (ref 0.0–0.7)
EOS PCT: 0 %
HCT: 39.8 % (ref 36.0–46.0)
Hemoglobin: 13.7 g/dL (ref 12.0–15.0)
Lymphocytes Relative: 14 %
Lymphs Abs: 1.7 10*3/uL (ref 0.7–4.0)
MCH: 31 pg (ref 26.0–34.0)
MCHC: 34.4 g/dL (ref 30.0–36.0)
MCV: 90 fL (ref 78.0–100.0)
Monocytes Absolute: 1.1 10*3/uL — ABNORMAL HIGH (ref 0.1–1.0)
Monocytes Relative: 9 %
NEUTROS PCT: 77 %
Neutro Abs: 9.6 10*3/uL — ABNORMAL HIGH (ref 1.7–7.7)
PLATELETS: 175 10*3/uL (ref 150–400)
RBC: 4.42 MIL/uL (ref 3.87–5.11)
RDW: 14.4 % (ref 11.5–15.5)
WBC: 12.4 10*3/uL — AB (ref 4.0–10.5)

## 2016-08-19 LAB — COMPREHENSIVE METABOLIC PANEL
ALBUMIN: 4.4 g/dL (ref 3.5–5.0)
ALT: 28 U/L (ref 14–54)
AST: 31 U/L (ref 15–41)
Alkaline Phosphatase: 62 U/L (ref 38–126)
Anion gap: 11 (ref 5–15)
BUN: 7 mg/dL (ref 6–20)
CHLORIDE: 103 mmol/L (ref 101–111)
CO2: 23 mmol/L (ref 22–32)
CREATININE: 0.8 mg/dL (ref 0.44–1.00)
Calcium: 9.8 mg/dL (ref 8.9–10.3)
GFR calc non Af Amer: >60 mL/min (ref >60)
GLUCOSE: 92 mg/dL (ref 65–99)
Potassium: 3.2 mmol/L — ABNORMAL LOW (ref 3.5–5.1)
SODIUM: 137 mmol/L (ref 135–145)
Total Bilirubin: 1.6 mg/dL — ABNORMAL HIGH (ref 0.3–1.2)
Total Protein: 7.9 g/dL (ref 6.5–8.1)

## 2016-08-19 LAB — PHOSPHORUS: Phosphorus: 6 mg/dL — ABNORMAL HIGH (ref 2.5–4.6)

## 2016-08-19 LAB — MAGNESIUM: Magnesium: 1.7 mg/dL (ref 1.7–2.4)

## 2016-08-19 MED ORDER — OXYCODONE HCL 5 MG PO TABS
10.0000 mg | ORAL_TABLET | Freq: Four times a day (QID) | ORAL | Status: DC | PRN
  Administered 2016-08-19 – 2016-08-24 (×18): 10 mg via ORAL
  Filled 2016-08-19 (×19): qty 2

## 2016-08-19 MED ORDER — TRAMADOL HCL 50 MG PO TABS
50.0000 mg | ORAL_TABLET | Freq: Four times a day (QID) | ORAL | Status: DC | PRN
Start: 2016-08-19 — End: 2016-08-19
  Filled 2016-08-19: qty 1

## 2016-08-19 MED ORDER — PANTOPRAZOLE SODIUM 40 MG IV SOLR
40.0000 mg | Freq: Every day | INTRAVENOUS | Status: DC
  Administered 2016-08-19 – 2016-08-20 (×2): 40 mg via INTRAVENOUS
  Filled 2016-08-19 (×2): qty 40

## 2016-08-19 MED ORDER — SODIUM CHLORIDE 0.9 % IV SOLN
30.0000 meq | Freq: Once | INTRAVENOUS | Status: AC
Start: 2016-08-19 — End: 2016-08-19
  Administered 2016-08-19: 30 meq via INTRAVENOUS
  Filled 2016-08-19: qty 15

## 2016-08-19 MED ORDER — PROCHLORPERAZINE EDISYLATE 5 MG/ML IJ SOLN
10.0000 mg | Freq: Four times a day (QID) | INTRAMUSCULAR | Status: DC | PRN
  Administered 2016-08-19 – 2016-08-20 (×2): 10 mg via INTRAVENOUS
  Filled 2016-08-19 (×2): qty 2

## 2016-08-19 MED ORDER — IOPAMIDOL (ISOVUE-300) INJECTION 61%
INTRAVENOUS | Status: AC
  Administered 2016-08-19: 100 mL
  Filled 2016-08-19: qty 100

## 2016-08-19 NOTE — Progress Notes (Signed)
MD made aware of temp 99.36F patient stated that is a fever for her r/t her cancer. Patient also asking if we can access port. MD stated he is waiting for CT scan results and that hematologist stated to not access port. Patient updated.

## 2016-08-19 NOTE — Progress Notes (Signed)
PROGRESS NOTE    Veronica Fitzgerald  [id number] DOB: July 02, 1976 DOA: 08/17/2016 PCP: No primary care provider on file.  Brief Narrative:  [name] a 40 y.o.female,Who presents via EMS secondary to altered mental status from a bus ride, she was on her from [place], as [place] she was noticed to be confused and combative, where EMS was called, patient is encephalopathic, unable to provide any review of system, there is no information about any family members, so all records and information were obtained from ED department , From records at her purse, appears she was in [place] ED 12[facility]17 [facility], [facility], and she presents to [facility] ED 12/19, she had 3 empty bottles of Zanaflex, hydrochlorothiazide and Phenergan filled in the summer, as well she had Benadryl in her purse, she can't give any history of any previous psychiatric history, records are pending from [place]. She was admitted for encephalopathy and is now coming around.   Assessment & Plan:   Principal Problem:   Encephalopathy Active Problems:   Diffuse large B cell lymphoma (HCC)   Abdominal pain   Bipolar disorder (HCC)   GERD (gastroesophageal reflux disease)   Leukocytosis   Hypokalemia  Acute Encephalopathy - Possibly related to medication, as she's been taking- Tizanidine, Benadryl, Phenergan, per ER records improvement of mental status after Ativan, CT head with no acute findings, -EEG and showed This awake and drowsy EEG is normal except for excess amount of diffuse low voltage beta activity commonly seen with sedating medications such as benzodiazepines - Unlikely infectious etiology given no leukocytosis, afebrile, nontoxic appearing. 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 Sedonia Small at [facility] -Records Requested will need to Fax records request  Abdominal Pain/Discomfort -KUB showed Scattered large and small bowel gas is noted. No obstructive changes are seen. Mild constipation is noted. Postsurgical changes in the lower lumbar spine are seen. No acute bony abnormality noted. No free air is seen. -Ordered CT of Abdomen and Pelvis with Contrast -? Releated to Malignacy  Bipolar Disorder/Anxiety/Depression -Per Dr. Tamsen Meek partner patient was on Xanax 1 mg q8h, Effexor, Lexapro, and Klonopin  GERD -Was on Protonix and Bentyl as an outpatient -Restart Protonix  Leukocytosis -? Etiology whether it is reactive or related to B-Cell Lymphoma -WBC went from 5.9 -> 12.4 -CT Abd/Pelvis Pending  Hypokalemia -Patient's K+ was 3.2 -Replete -Repeat CMP in AM  DVT prophylaxis: SCD's and Heparin 5000 sq Code Status: FULL Family Communication: No Family present at bedside Disposition Plan: SNF  Consultants:   [facility] Hematology   Procedures: CT Abd/Pelvis Pending  Antimicrobials: None  Subjective: Was seen and examined and more alert today and knew she was in the hospital. Was able to tell me her Oncologists name. Had Nausea and Abdominal Discomfort especially rebound tenderness. No CP or SOB. No other concerns or complaints at this time.   Objective: Vitals:   08/18/16 [id number] 08/18/16 2154 08/19/16 0505 08/19/16 1449  BP: 100/67 (!) 137/96 121/90 113/82  Pulse: 85 (!) 115 92 95  Resp: 18 16 16    Temp: 98.6 F (37 C) 98.1 F (36.7 C) 98.3 F (36.8 C) 99.5 F (37.5 C)  TempSrc: Oral Oral Oral  Oral  SpO2: 100% 98% 100% 98%  Weight:        Intake/Output Summary (Last 24 hours) at 08/19/16 1503 Last data filed at 08/19/16 1411  Gross per 24 hour  Intake              980 ml  Output             1600 ml  Net             -620 ml   Filed Weights   08/17/16 2054  Weight: 69.9 kg (154 lb 1.6 oz)    Examination: Physical Exam:  Constitutional: Thin female. NAD and appears calm and older than stated age Eyes: Lids and conjunctivae normal, sclerae anicteric  ENMT: External Ears, Nose appear normal. Grossly normal hearing. Mucous membranes are moist. No Dentition. Neck: Appears normal, supple, no cervical masses, normal ROM, no appreciable thyromegaly, no JVD Respiratory: Clear to auscultation bilaterally, no wheezing, rales, rhonchi or crackles. Normal respiratory effort and patient is not tachypenic. No accessory muscle use.  Cardiovascular: RRR, no murmurs / rubs / gallops. S1 and S2 auscultated. No extremity edema.  Chest Wall:  Has chemo-port on Right Abdomen: Soft, tender to palpation and has rebound tenderness. No masses palpated. No appreciable hepatosplenomegaly or ascites. Bowel sounds positive x4.  GU: Deferred. Musculoskeletal: No clubbing / cyanosis of digits/nails. No joint deformity upper and lower extremities.  Skin: No rashes, lesions, ulcers on limited skin evaluation. No induration; Warm and dry.  Neurologic: CN 2-12 grossly intact with no focal deficits. Sensation intact in all 4 Extremities. Romberg sign cerebellar reflexes not assessed.  Psychiatric: Normal judgment and insight. Alert and oriented x 2. Normal mood and flat affect.   Data Reviewed: I have personally reviewed following labs and imaging studies  CBC:  Recent Labs Lab 08/17/16 1316 08/18/16 0539 08/19/16 1229  WBC 8.1 5.9 12.4*  NEUTROABS 5.0  --  9.6*  HGB 12.9 13.0 13.7  HCT 38.6 38.9 39.8  MCV 92.3 90.7 90.0  PLT 139* 152 0000000   Basic Metabolic Panel:  Recent Labs Lab  08/17/16 1316 08/18/16 0539 08/19/16 1229  NA 139 137 137  K 3.7 3.5 3.2*  CL 107 106 103  CO2 29 24 23   GLUCOSE 117* 103* 92  BUN 6 <5* 7  CREATININE 0.76 0.74 0.80  CALCIUM 9.4 8.9 9.8  MG  --   --  1.7  PHOS  --   --  6.0*   GFR: CrCl cannot be calculated (Unknown ideal weight.). Liver Function Tests:  Recent Labs Lab 08/17/16 1316 08/19/16 1229  AST 31 31  ALT 29 28  ALKPHOS 70 62  BILITOT 0.5 1.6*  PROT 7.6 7.9  ALBUMIN 4.1 4.4    Recent Labs Lab 08/18/16 0853  LIPASE 14   No results for input(s): AMMONIA in the last 168 hours. Coagulation Profile: No results for input(s): INR, PROTIME in the last 168 hours. Cardiac Enzymes: No results for input(s): CKTOTAL, CKMB, CKMBINDEX, TROPONINI in the last 168 hours. BNP (last 3 results) No results for input(s): PROBNP in the last 8760 hours. HbA1C: No results for input(s): HGBA1C in the last 72 hours. CBG: No results for input(s): GLUCAP in the last 168 hours.  Lipid Profile: No results for input(s): CHOL, HDL, LDLCALC, TRIG, CHOLHDL, LDLDIRECT in the last 72 hours. Thyroid Function Tests:  Recent Labs  08/17/16 2050  TSH 3.917   Anemia Panel:  Recent Labs  08/17/16 2050  VITAMINB12 751  FOLATE 47.7   Sepsis Labs: No results for input(s): PROCALCITON, LATICACIDVEN in the last 168 hours.  No results found for this or any previous visit (from the past 240 hour(s)).  Radiology Studies: Ct Head Wo Contrast  Result Date: 08/17/2016 CLINICAL DATA:  Altered mental status EXAM: CT HEAD WITHOUT CONTRAST TECHNIQUE: Contiguous axial images were obtained from the base of the skull through the vertex without intravenous contrast. COMPARISON:  None. FINDINGS: Brain: No evidence of acute infarction, hemorrhage, hydrocephalus, extra-axial collection or mass lesion/mass effect. Vascular: No hyperdense vessel or unexpected calcification. Skull: Negative Sinuses/Orbits: Negative Other: None IMPRESSION: Normal CT head  Electronically Signed   By: Franchot Gallo M.D.   On: 08/17/2016 16:43   Dg Abd Portable 1v  Result Date: 08/18/2016 CLINICAL DATA:  Abdominal pain EXAM: PORTABLE ABDOMEN - 1 VIEW COMPARISON:  None. FINDINGS: Scattered large and small bowel gas is noted. No obstructive changes are seen. Mild constipation is noted. Postsurgical changes in the lower lumbar spine are seen. No acute bony abnormality noted. No free air is seen. IMPRESSION: Mild constipation.  No acute abnormality is noted. Electronically Signed   By: Inez Catalina M.D.   On: 08/18/2016 13:06   Scheduled Meds: . folic acid  1 mg Intravenous Daily  . heparin  5,000 Units Subcutaneous Q8H  . multivitamin with minerals  1 tablet Oral Daily  . potassium chloride (KCL MULTIRUN) 30 mEq in 265 mL IVPB  30 mEq Intravenous Once  . senna-docusate  1 tablet Oral BID  . sodium chloride flush  3 mL Intravenous Q12H  . thiamine  100 mg Intravenous Daily   Continuous Infusions:   LOS: 1 day   Kerney Elbe, DO [facility] Pager [phone]  If 7PM-7AM, please contact night-coverage [email or web address] Password [facility] 08/19/2016, 3:03 PM

## 2016-08-19 NOTE — Evaluation (Signed)
Physical Therapy Evaluation Patient Details Name: Veronica Fitzgerald MRN: [id number] DOB: 1976-06-05 Today's Date: 08/19/2016   History of Present Illness  40 y.o. female, Who presents via EMS secondary to altered mental status  in the setting of acute encephalopathy.  Clinical Impression  Patient demonstrates deficits in functional mobility as indicated below. Will need continued skilled PT to address deficits and maximize function. Will see as indicated and progress as tolerated. At this time, patient is extremely ataxic, inconsistent effort at times. High fall risk. Recommend ST SNF unless able to have 24/7 physical assist upon discharge.     Follow Up Recommendations SNF;Supervision/Assistance - 24 hour (unless 24/7 physical assist can be provided)    Equipment Recommendations  Other (comment) (TBD)    Recommendations for Other Services       Precautions / Restrictions Precautions Precautions: Fall Restrictions Weight Bearing Restrictions: No      Mobility  Bed Mobility Overal bed mobility: Needs Assistance Bed Mobility: Supine to Sit;Sit to Supine     Supine to sit: Min assist Sit to supine: Min assist   General bed mobility comments: Increased time and effort to come to EOB, poor sitting balance, multiple cues to adjust over BOS.  Transfers Overall transfer level: Needs assistance Equipment used: Rolling walker (2 wheeled) Transfers: Sit to/from Stand Sit to Stand: Min assist         General transfer comment: Min assist for stability during elevation to upright, posterior LOB x3  Ambulation/Gait Ambulation/Gait assistance: Min assist (3 episodes of moderate assist) Ambulation Distance (Feet): 90 Feet Assistive device: Rolling walker (2 wheeled) Gait Pattern/deviations: Step-through pattern;Decreased stride length;Ataxic;Drifts right/left;Trunk flexed;Narrow base of support Gait velocity: decreased   General Gait Details: patient extremely ataxic with mobility,  inability to contain gait within BOS, assist for stability and safety, increased assist for LOB (VCs for increased cadence)  Stairs            Wheelchair Mobility    Modified Rankin (Stroke Patients Only)       Balance Overall balance assessment: History of Falls                                           Pertinent Vitals/Pain Pain Assessment: 0-10 Pain Score: 9  Pain Location: right hip  Pain Descriptors / Indicators: Guarding;Grimacing;Discomfort;Sore Pain Intervention(s): Monitored during session;Limited activity within patient's tolerance    Home Living Family/patient expects to be discharged to:: Private residence Living Arrangements: Spouse/significant other Available Help at Discharge: Available PRN/intermittently Type of Home: House Home Access: Ramped entrance     Home Layout: Two level;Able to live on main level with bedroom/bathroom Home Equipment: None      Prior Function Level of Independence: Independent               Hand Dominance   Dominant Hand: Right    Extremity/Trunk Assessment   Upper Extremity Assessment Upper Extremity Assessment: Generalized weakness    Lower Extremity Assessment Lower Extremity Assessment: Generalized weakness;RLE deficits/detail RLE Deficits / Details: limited strength but poo effort upon testing, question if limited by pain or self effort RLE: Unable to fully assess due to pain RLE Sensation: decreased proprioception RLE Coordination: decreased fine motor;decreased gross motor       Communication   Communication: No difficulties  Cognition Arousal/Alertness: Awake/alert Behavior During Therapy: Impulsive;Anxious Overall Cognitive Status: Impaired/Different from baseline Area of Impairment: Attention;Following  commands;Safety/judgement;Awareness;Problem solving   Current Attention Level: Sustained   Following Commands: Follows one step commands with increased  time Safety/Judgement: Decreased awareness of safety;Decreased awareness of deficits Awareness: Emergent Problem Solving: Slow processing;Decreased initiation;Difficulty sequencing;Requires verbal cues;Requires tactile cues General Comments: no caregiver present at this time    General Comments      Exercises     Assessment/Plan    PT Assessment Patient needs continued PT services  PT Problem List Decreased strength;Decreased activity tolerance;Decreased balance;Decreased mobility;Decreased coordination;Decreased cognition;Decreased safety awareness;Pain          PT Treatment Interventions DME instruction;Gait training;Functional mobility training;Therapeutic activities;Therapeutic exercise;Balance training;Patient/family education    PT Goals (Current goals can be found in the Care Plan section)  Acute Rehab PT Goals Patient Stated Goal: to get better PT Goal Formulation: With patient Time For Goal Achievement: 09/02/16 Potential to Achieve Goals: Fair    Frequency Min 3X/week   Barriers to discharge Decreased caregiver support      Co-evaluation               End of Session Equipment Utilized During Treatment: Gait belt Activity Tolerance: Patient tolerated treatment well Patient left: in bed;with call bell/phone within reach;with bed alarm set Nurse Communication: Mobility status         Time: [id number] PT Time Calculation (min) (ACUTE ONLY): 20 min   Charges:   PT Evaluation $PT Eval Moderate Complexity: 1 Procedure     PT G Codes:        Duncan Dull 09/10/2016, 1:19 PM Alben Deeds, [place] DPT  [phone]

## 2016-08-20 DIAGNOSIS — R112 Nausea with vomiting, unspecified: Secondary | ICD-10-CM

## 2016-08-20 DIAGNOSIS — F319 Bipolar disorder, unspecified: Secondary | ICD-10-CM

## 2016-08-20 LAB — CBC WITH DIFFERENTIAL/PLATELET
Basophils Absolute: 0 10*3/uL (ref 0.0–0.1)
Basophils Relative: 0 %
Eosinophils Absolute: 0.1 10*3/uL (ref 0.0–0.7)
Eosinophils Relative: 2 %
HEMATOCRIT: 40.4 % (ref 36.0–46.0)
HEMOGLOBIN: 13.2 g/dL (ref 12.0–15.0)
LYMPHS ABS: 1.6 10*3/uL (ref 0.7–4.0)
LYMPHS PCT: 30 %
MCH: 30.5 pg (ref 26.0–34.0)
MCHC: 32.7 g/dL (ref 30.0–36.0)
MCV: 93.3 fL (ref 78.0–100.0)
Monocytes Absolute: 0.5 10*3/uL (ref 0.1–1.0)
Monocytes Relative: 10 %
NEUTROS ABS: 3.1 10*3/uL (ref 1.7–7.7)
NEUTROS PCT: 58 %
Platelets: 150 10*3/uL (ref 150–400)
RBC: 4.33 MIL/uL (ref 3.87–5.11)
RDW: 14.7 % (ref 11.5–15.5)
WBC: 5.4 10*3/uL (ref 4.0–10.5)

## 2016-08-20 LAB — COMPREHENSIVE METABOLIC PANEL
ALK PHOS: 55 U/L (ref 38–126)
ALT: 27 U/L (ref 14–54)
AST: 30 U/L (ref 15–41)
Albumin: 4.2 g/dL (ref 3.5–5.0)
Anion gap: 8 (ref 5–15)
BUN: 10 mg/dL (ref 6–20)
CALCIUM: 9.2 mg/dL (ref 8.9–10.3)
CO2: 23 mmol/L (ref 22–32)
CREATININE: 0.84 mg/dL (ref 0.44–1.00)
Chloride: 104 mmol/L (ref 101–111)
GFR calc non Af Amer: >60 mL/min (ref >60)
Glucose, Bld: 119 mg/dL — ABNORMAL HIGH (ref 65–99)
Potassium: 3.6 mmol/L (ref 3.5–5.1)
SODIUM: 135 mmol/L (ref 135–145)
Total Bilirubin: 1.1 mg/dL (ref 0.3–1.2)
Total Protein: 7.7 g/dL (ref 6.5–8.1)

## 2016-08-20 LAB — URINALYSIS, ROUTINE W REFLEX MICROSCOPIC
BILIRUBIN URINE: NEGATIVE
Glucose, UA: NEGATIVE mg/dL
Hgb urine dipstick: NEGATIVE
KETONES UR: NEGATIVE mg/dL
Leukocytes, UA: NEGATIVE
NITRITE: NEGATIVE
PH: 5 (ref 5.0–8.0)
Protein, ur: NEGATIVE mg/dL
Specific Gravity, Urine: 1.016 (ref 1.005–1.030)

## 2016-08-20 LAB — PHOSPHORUS: PHOSPHORUS: 3.3 mg/dL (ref 2.5–4.6)

## 2016-08-20 LAB — MAGNESIUM: Magnesium: 1.7 mg/dL (ref 1.7–2.4)

## 2016-08-20 MED ORDER — FENTANYL 12 MCG/HR TD PT72
12.5000 ug | MEDICATED_PATCH | TRANSDERMAL | Status: DC
  Administered 2016-08-20 – 2016-08-23 (×2): 12.5 ug via TRANSDERMAL
  Filled 2016-08-20 (×2): qty 1

## 2016-08-20 MED ORDER — PROMETHAZINE HCL 25 MG/ML IJ SOLN
12.5000 mg | Freq: Four times a day (QID) | INTRAMUSCULAR | Status: DC | PRN
  Administered 2016-08-20 – 2016-08-23 (×9): 12.5 mg via INTRAVENOUS
  Filled 2016-08-20 (×9): qty 1

## 2016-08-20 MED ORDER — HYDROMORPHONE HCL 2 MG/ML IJ SOLN: 0.5000 mg | INTRAMUSCULAR | Status: DC | PRN

## 2016-08-20 MED ORDER — SUCRALFATE 1 G PO TABS
1.0000 g | ORAL_TABLET | Freq: Three times a day (TID) | ORAL | Status: DC
  Administered 2016-08-20 – 2016-08-25 (×21): 1 g via ORAL
  Filled 2016-08-20 (×21): qty 1

## 2016-08-20 MED ORDER — ALPRAZOLAM 0.5 MG PO TABS
0.5000 mg | ORAL_TABLET | Freq: Three times a day (TID) | ORAL | Status: DC | PRN
  Administered 2016-08-20 – 2016-08-24 (×13): 0.5 mg via ORAL
  Filled 2016-08-20 (×13): qty 1

## 2016-08-20 MED ORDER — SODIUM CHLORIDE 0.9 % IV SOLN
INTRAVENOUS | Status: DC
  Administered 2016-08-20 – 2016-08-21 (×3): via INTRAVENOUS

## 2016-08-20 MED ORDER — FOLIC ACID 1 MG PO TABS
1.0000 mg | ORAL_TABLET | Freq: Every day | ORAL | Status: DC
  Administered 2016-08-21 – 2016-08-25 (×5): 1 mg via ORAL
  Filled 2016-08-20 (×5): qty 1

## 2016-08-20 MED ORDER — ZOLPIDEM TARTRATE 5 MG PO TABS
5.0000 mg | ORAL_TABLET | Freq: Every evening | ORAL | Status: DC | PRN
  Administered 2016-08-20 – 2016-08-25 (×5): 5 mg via ORAL
  Filled 2016-08-20 (×5): qty 1

## 2016-08-20 MED ORDER — POLYETHYLENE GLYCOL 3350 17 G PO PACK
17.0000 g | PACK | Freq: Every day | ORAL | Status: DC
  Administered 2016-08-20 – 2016-08-25 (×6): 17 g via ORAL
  Filled 2016-08-20 (×6): qty 1

## 2016-08-20 MED ORDER — MORPHINE SULFATE (PF) 2 MG/ML IV SOLN
1.0000 mg | Freq: Once | INTRAVENOUS | Status: AC
Start: 2016-08-20 — End: 2016-08-20
  Administered 2016-08-20: 1 mg via INTRAVENOUS
  Filled 2016-08-20: qty 1

## 2016-08-20 MED ORDER — VITAMIN B-1 100 MG PO TABS
100.0000 mg | ORAL_TABLET | Freq: Every day | ORAL | Status: DC
  Administered 2016-08-21 – 2016-08-25 (×5): 100 mg via ORAL
  Filled 2016-08-20 (×5): qty 1

## 2016-08-20 MED ORDER — PANTOPRAZOLE SODIUM 40 MG PO TBEC
40.0000 mg | DELAYED_RELEASE_TABLET | Freq: Every day | ORAL | Status: DC
  Administered 2016-08-21 – 2016-08-23 (×3): 40 mg via ORAL
  Filled 2016-08-20 (×3): qty 1

## 2016-08-20 NOTE — NC FL2 (Signed)
[place] LEVEL OF CARE SCREENING TOOL     IDENTIFICATION  Patient Name: Veronica Fitzgerald Birthdate: [birth year] Sex: female Admission Date (Current Location): 08/17/2016  [facility] and [place] Number:  Herbalist and Address:  [facility] [place], [place] [address] [place], [place] [address]      Provider Number: [id number]  Attending Physician Name and Address:  Kerney Elbe, DO  Relative Name and Phone Number:       Current Level of Care: Hospital Recommended Level of Care: [place] Prior Approval Number:    Date Approved/Denied:   PASRR Number:    Discharge Plan: SNF    Current Diagnoses: Patient Active Problem List   Diagnosis Date Noted  . Diffuse large B cell lymphoma ([place]) 08/19/2016  . Abdominal pain 08/19/2016  . Bipolar disorder ([place]) 08/19/2016  . GERD (gastroesophageal reflux disease) 08/19/2016  . Leukocytosis 08/19/2016  . Hypokalemia 08/19/2016  . Encephalopathy 08/17/2016    Orientation RESPIRATION BLADDER Height & Weight     Self, Place, Time  Normal Continent Weight: 69.9 kg (154 lb 1.6 oz) Height:     BEHAVIORAL SYMPTOMS/MOOD NEUROLOGICAL BOWEL NUTRITION STATUS      Continent Diet (Please see DC Summary)  AMBULATORY STATUS COMMUNICATION OF NEEDS Skin   Limited Assist Verbally Normal                       Personal Care Assistance Level of Assistance  Bathing, Feeding, Dressing Bathing Assistance: Limited assistance Feeding assistance: Independent Dressing Assistance: Limited assistance     Functional Limitations Info             SPECIAL CARE FACTORS FREQUENCY  PT (By licensed PT)     PT Frequency: 5x/week              Contractures      Additional Factors Info  Code Status, Allergies Code Status Info: Full Allergies Info: Tramadol           Current Medications (08/20/2016):  This is the current hospital active medication list Current Facility-Administered  Medications  Medication Dose Route Frequency Provider Last Rate Last Dose  . folic acid injection 1 mg  1 mg Intravenous Daily Albertine Patricia, MD   1 mg at 08/19/16 0919  . heparin injection 5,000 Units  5,000 Units Subcutaneous Q8H Albertine Patricia, MD   5,000 Units at 08/18/16 [id number]  . LORazepam (ATIVAN) injection 1 mg  1 mg Intravenous Q4H PRN Albertine Patricia, MD      . multivitamin with minerals tablet 1 tablet  1 tablet Oral Daily Albertine Patricia, MD   1 tablet at 08/18/16 1142  . ondansetron (ZOFRAN) injection 4 mg  4 mg Intravenous Q6H PRN Gardiner Barefoot, NP   4 mg at 08/20/16 0439  . oxyCODONE (Oxy IR/ROXICODONE) immediate release tablet 10 mg  10 mg Oral Q6H PRN Bertram Savin Sheikh, DO   10 mg at 08/20/16 0439  . pantoprazole (PROTONIX) injection 40 mg  40 mg Intravenous Daily [facility], DO   40 mg at 08/19/16 1719  . prochlorperazine (COMPAZINE) injection 10 mg  10 mg Intravenous Q6H PRN Bertram Savin Sheikh, DO   10 mg at 08/19/16 1846  . senna-docusate (Senokot-S) tablet 1 tablet  1 tablet Oral BID Geradine Girt, DO   1 tablet at 08/19/16 2101  . sodium chloride flush (NS) 0.9 % injection 3 mL  3  mL Intravenous Q12H Albertine Patricia, MD   3 mL at 08/19/16 0920  . thiamine (B-1) injection 100 mg  100 mg Intravenous Daily Albertine Patricia, MD   100 mg at 08/19/16 [id number]     Discharge Medications: Please see discharge summary for a list of discharge medications.  Relevant Imaging Results:  Relevant Lab Results:   Additional Information SSN: [place], [place]

## 2016-08-20 NOTE — Progress Notes (Signed)
PROGRESS NOTE    Veronica Fitzgerald  [id number] DOB: 1976-01-09 DOA: 08/17/2016 PCP: No PCP Per Patient  Brief Narrative:  [name] a 40 y.o.female,Who presents via EMS secondary to altered mental status from a bus ride, she was on her from [place], as [name] she was noticed to be confused and combative, where EMS was called, patient is encephalopathic, unable to provide any review of system, there is no information about any family members, so all records and information were obtained from ED department , From records at her purse, appears she was in [place] ED 12/17 for vomiting, and [facility] as well, and she presents to [facility] ED 12/19, she had 3 empty bottles of Zanaflex, hydrochlorothiazide and Phenergan filled in the summer, as well she had Benadryl in her purse, she can't give any history of any previous psychiatric history, records are pending from [place]. She was admitted for encephalopathy and is now lucid. Her main complaints today are abdominal Pain and Nausea and Vomiting.   Assessment & Plan:   Principal Problem:   Encephalopathy Active Problems:   Diffuse large B cell lymphoma (HCC)   Abdominal pain   Bipolar disorder (HCC)   GERD (gastroesophageal reflux disease)   Leukocytosis   Hypokalemia  Acute Encephalopathy, resolved - Possibly related to medication, as she's been taking- Tizanidine, Benadryl, Phenergan, per ER records improvement of mental status after Ativan, CT head with no acute findings. Has social issues at home with significant other.  -EEG and showed This awake and drowsy EEG is normal except for excess amount of diffuse low voltage beta activity commonly seen with sedating medications such as benzodiazepines - Unclear if it psychiatric history, there is no contact information for any family members-- sent request to PCP in [place] for medical History -asking for IV pain medications-- will avoid -Have sent queries to all  hospitals patient says she has visited-- [facility], [facility], [facility] and no records found for inpatient status currently -FAX Number for Hematology Clinic at [facility] is [phone] -Was placed on CIWA incase of EtOH withdrawal; C/w MVI, Folic Acid and Thiamine -PT Recommends SNF -> Patient would like to go to SNF in [place]  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 Sedonia Small at [facility] with Oxycodone and Fentanyl -Zofran and Compazine for N/C -Records Requested and are trying to Fax records request  Abdominal Pain/Discomfort with Nausea and Vomiting with unclear Etiology -KUB showed Scattered large and small bowel gas is noted. No obstructive changes are seen. Mild constipation is noted. Postsurgical changes in the lower lumbar spine are seen. No acute bony abnormality noted. No free air is seen. -CT of Abdomen and Pelvis with Contrast showed No acute abnormality or finding to explain the patient's symptoms; She is s/p Cholecystectomy and had Prominent Stool in Ascending Colon noted.  -Lipase level 14 and LFT's WNL -? Releated to Malignancy -Ordered Urinalysis with Urine Cx as patient complained of Hematuria and pressure over bladder and Lower Right Quadrant Pain radiating into groin. -C/w Oxycodone 10 mg q6hprn for Moderate Pain and Fenatnyl 12.5 mcg TD Patch -Bowel Regimen with Senna-Doucsate and Miralax -Started on IVF Rehydration at 100 mL/Hr because of poor po intake and N/V -C/w Zofran IV and Compazine for N/V -Advance Diet as Tolerated.   Bipolar Disorder/Anxiety/Depression -Per Dr. Tamsen Meek partner patient was on Xanax 1 mg q8h, Effexor, Lexapro,  and Klonopin -Restarted Xanax at 0.5 mg po TIDprn and restarted Zolpidem 5 mg for Insomnia -Will restart other meds once have Records  GERD -Was on Protonix and Bentyl as an outpatient -Restart Protonix -Added  Carafate 1 gram TID wm and Bedtime  Leukocytosis, Resolved -? Etiology whether it is reactive or related to B-Cell Lymphoma -WBC went from 5.9 -> 12.4 ->  5.4 -CT Abd/Pelvis Negative -Ordered UA and Urine Cx -Repeat CBC in AM  Hypokalemia, Improved -Patient's K+ was 3.2 and went to 3.6 -Replete with Potassium Chloride 30 mEQ Multirun -Repeat CMP in AM  DVT prophylaxis: SCD's and Heparin 5000 sq Code Status: FULL Family Communication: No Family present at bedside Disposition Plan: SNF  Consultants:   [facility]   Procedures: CT Abd/Pelvis   Antimicrobials: None  Subjective: Was seen and examined this AM and was very lucid. Complained of Abdominal Pain again and Nausea and Vomiting early this Am. States she could not keep anything down. Had no CP/SOB but complained of Lower Right Groin pain and stated to the nurse that she had some hematuria. No other concerns except that her pain was uncontrolled.   Objective: Vitals:   08/19/16 1618 08/19/16 2109 08/20/16 0612 08/20/16 1420  BP:  109/76 101/69 (!) 92/57  Pulse:  89 98 90  Resp:  16 16   Temp: 99.2 F (37.3 C) 98.5 F (36.9 C) 98.2 F (36.8 C) 97.8 F (36.6 C)  TempSrc: Oral Oral Oral Oral  SpO2:  98% 96% 100%  Weight:        Intake/Output Summary (Last 24 hours) at 08/20/16 1449 Last data filed at 08/20/16 0913  Gross per 24 hour  Intake              120 ml  Output             1600 ml  Net            -1480 ml   Filed Weights   08/17/16 2054  Weight: 69.9 kg (154 lb 1.6 oz)   Examination: Physical Exam:  Constitutional: Thin female. NAD and appears calm and older than stated age Eyes: Lids and conjunctivae normal, sclerae anicteric  ENMT: External Ears, Nose appear normal. Grossly normal hearing. Mucous membranes are moist. No Dentition. Neck: Appears normal, supple, no cervical masses, normal ROM, no appreciable thyromegaly, no JVD Respiratory: Clear to auscultation bilaterally, no  wheezing, rales, rhonchi or crackles. Normal respiratory effort and patient is not tachypenic. No accessory muscle use.  Cardiovascular: RRR, no murmurs / rubs / gallops. S1 and S2 auscultated. No extremity edema.  Chest Wall:  Has chemo-port on Right; non Erythematous Abdomen: Soft, tender to palpation and has rebound tenderness again. No masses palpated. No appreciable hepatosplenomegaly or ascites. Bowel sounds positive x4.  GU: Deferred. Musculoskeletal: No clubbing / cyanosis of digits/nails. No joint deformity upper and lower extremities.  Skin: No rashes, lesions, ulcers on limited skin evaluation. No induration; Warm and dry.  Neurologic: CN 2-12 grossly intact with no focal deficits. Sensation intact in all 4 Extremities. Romberg sign cerebellar reflexes not assessed.  Psychiatric: Normal judgment and insight. Alert and oriented x 3. Depressed mood and flat affect.   Data Reviewed: I have personally reviewed following labs and imaging studies  CBC:  Recent Labs Lab 08/17/16 1316 08/18/16 0539 08/19/16 1229 08/20/16 0620  WBC 8.1 5.9 12.4* 5.4  NEUTROABS 5.0  --  9.6* 3.1  HGB 12.9 13.0 13.7 13.2  HCT 38.6  38.9 39.8 40.4  MCV 92.3 90.7 90.0 93.3  PLT 139* 152 175 [id number]   Basic Metabolic Panel:  Recent Labs Lab 08/17/16 1316 08/18/16 0539 08/19/16 1229 08/20/16 0620  NA 139 137 137 135  K 3.7 3.5 3.2* 3.6  CL 107 106 103 104  CO2 29 24 23 23   GLUCOSE 117* 103* 92 119*  BUN 6 <5* 7 10  CREATININE 0.76 0.74 0.80 0.84  CALCIUM 9.4 8.9 9.8 9.2  MG  --   --  1.7 1.7  PHOS  --   --  6.0* 3.3   GFR: CrCl cannot be calculated (Unknown ideal weight.). Liver Function Tests:  Recent Labs Lab 08/17/16 1316 08/19/16 1229 08/20/16 0620  AST 31 31 30   ALT 29 28 27   ALKPHOS 70 62 55  BILITOT 0.5 1.6* 1.1  PROT 7.6 7.9 7.7  ALBUMIN 4.1 4.4 4.2    Recent Labs Lab 08/18/16 0853  LIPASE 14   No results for input(s): AMMONIA in the last 168 hours. Coagulation  Profile: No results for input(s): INR, PROTIME in the last 168 hours. Cardiac Enzymes: No results for input(s): CKTOTAL, CKMB, CKMBINDEX, TROPONINI in the last 168 hours. BNP (last 3 results) No results for input(s): PROBNP in the last 8760 hours. HbA1C: No results for input(s): HGBA1C in the last 72 hours. CBG: No results for input(s): GLUCAP in the last 168 hours. Lipid Profile: No results for input(s): CHOL, HDL, LDLCALC, TRIG, CHOLHDL, LDLDIRECT in the last 72 hours. Thyroid Function Tests:  Recent Labs  08/17/16 2050  TSH 3.917   Anemia Panel:  Recent Labs  08/17/16 2050  VITAMINB12 751  FOLATE 47.7   Sepsis Labs: No results for input(s): PROCALCITON, LATICACIDVEN in the last 168 hours.  No results found for this or any previous visit (from the past 240 hour(s)).  Radiology Studies: Ct Abdomen Pelvis W Contrast  Result Date: 08/19/2016 CLINICAL DATA:  Lower abdominal and right groin pain in a patient with history of pancreatitis. EXAM: CT ABDOMEN AND PELVIS WITH CONTRAST TECHNIQUE: Multidetector CT imaging of the abdomen and pelvis was performed using the standard protocol following bolus administration of intravenous contrast. CONTRAST:  100 ml ISOVUE-300 IOPAMIDOL (ISOVUE-300) INJECTION 61% COMPARISON:  None. FINDINGS: Lower chest: Lung bases clear.  No pleural or pericardial effusion. Hepatobiliary: Status post cholecystectomy. Liver and biliary tree are unremarkable. Pancreas: Unremarkable. No pancreatic ductal dilatation or surrounding inflammatory changes. Spleen: Normal in size without focal abnormality. Adrenals/Urinary Tract: 1 cm cyst lower pole right kidney is noted. The kidneys are otherwise unremarkable. The urinary bladder is distended but otherwise unremarkable. The adrenal glands appear normal. Stomach/Bowel: Stomach is within normal limits. Appendix appears normal. No evidence of bowel wall thickening, distention, or inflammatory changes. Prominent stool  ascending colon noted. Vascular/Lymphatic: No atherosclerotic vascular disease is seen. No lymphadenopathy. Retroaortic left renal vein is incidentally noted. Reproductive: Status post hysterectomy. No adnexal masses. Other: No abdominal wall hernia or abnormality. No abdominopelvic ascites. Musculoskeletal: Status post L4-S1 fusion. No lytic or sclerotic lesion. IMPRESSION: No acute abnormality or finding to explain the patient's symptoms. Electronically Signed   By: Inge Rise M.D.   On: 08/19/2016 16:17   Scheduled Meds: . fentaNYL  12.5 mcg Transdermal Q72H  . [START ON 123456 folic acid  1 mg Oral Daily  . heparin  5,000 Units Subcutaneous Q8H  . multivitamin with minerals  1 tablet Oral Daily  . [START ON 08/21/2016] pantoprazole  40 mg Oral Daily  . polyethylene  glycol  17 g Oral Daily  . senna-docusate  1 tablet Oral BID  . sodium chloride flush  3 mL Intravenous Q12H  . sucralfate  1 g Oral TID WC & HS  . [START ON 08/21/2016] thiamine  100 mg Oral Daily   Continuous Infusions: . sodium chloride 100 mL/hr at 08/20/16 1050    LOS: 2 days   Kerney Elbe, DO [facility] Pager [phone]  If 7PM-7AM, please contact night-coverage [email or web address] Password [facility] 08/20/2016, 2:49 PM

## 2016-08-20 NOTE — Progress Notes (Signed)
Pt vomited shortly after pain med was administrated. C/o abdominal pain 9/10 and requesting pain meds. Baltazar Najjar, NP notified.

## 2016-08-20 NOTE — Clinical Social Work Note (Signed)
Clinical Social Work Assessment  Patient Details  Name: Veronica Fitzgerald MRN: [id number] Date of Birth: 1975/11/04  Date of referral:  08/20/16               Reason for consult:  Facility Placement                Permission sought to share information with:  Facility Sport and exercise psychologist, Family Supports Permission granted to share information::  Yes, Verbal Permission Granted  Name::     Museum/gallery conservator::  SNFs  Relationship::  Son  Contact Information:     [phone]   Housing/Transportation Living arrangements for the past 2 months:  No permanent address Source of Information:  Patient Patient Interpreter Needed:  None Criminal Activity/Legal Involvement Pertinent to Current Situation/Hospitalization:  No - Comment as needed Significant Relationships:  Adult Children, Significant Other Lives with:  Significant Other Do you feel safe going back to the place where you live?  No Need for family participation in patient care:  No (Coment)  Care giving concerns:  CSW received consult for possible SNF placement at time of discharge. CSW met with patient regarding PT recommendation of SNF placement at time of discharge. Patient reported that she would like to go to a snf in [place] where her "Old Man" Hulen Skains lives. She states he dropped her off here to visit her son. She is unsure how she got to [place]. She reports that her Cancer doctor is in [facility] and that she just got the news that she has spots on her lungs. CSW explained that we would not be able to get her to [place] but that we could find a rehab near [place]. Patient expressed understanding of PT recommendation and is agreeable to SNF placement at time of discharge. CSW to continue to follow and assist with discharge planning needs.   Social Worker assessment / plan:  CSW spoke with patient concerning possibility of rehab at [facility] before returning home.  Employment status:  Disabled (Comment on whether or not currently  receiving Disability) Insurance information:  Medicare, Medicaid In [place] PT Recommendations:  [place] / Referral to community resources:  [place]  Patient/Family's Response to care:  Patient recognizes need for rehab before returning home and is agreeable to a SNF. Patient reported preference for [facility]  Patient/Family's Understanding of and Emotional Response to Diagnosis, Current Treatment, and Prognosis:  Patient/family is realistic regarding therapy needs and expressed being hopeful for SNF placement. Patient expressed understanding of CSW role and discharge process. No questions/concerns about plan or treatment.    Emotional Assessment Appearance:  Appears older than stated age Attitude/Demeanor/Rapport:  Crying (Appropriate) Affect (typically observed):  Accepting, Tearful/Crying, Sad Orientation:  Oriented to Self, Oriented to Place, Oriented to  Time Alcohol / Substance use:  Not Applicable Psych involvement (Current and /or in the community):  Yes (Comment) (Psych consult)  Discharge Needs  Concerns to be addressed:  Care Coordination Readmission within the last 30 days:  No Current discharge risk:  Dependent with Mobility Barriers to Discharge:  Continued Medical Work up   [employer], [place] 08/20/2016, 5:00 PM

## 2016-08-20 NOTE — Progress Notes (Signed)
Pasrr is under manual review. Pasrr office closed until next Thursday. Patient cannot discharge to snf without pasrr.  Percell Locus Dominie Benedick LCSWA [phone]

## 2016-08-21 DIAGNOSIS — K5903 Drug induced constipation: Secondary | ICD-10-CM

## 2016-08-21 LAB — COMPREHENSIVE METABOLIC PANEL
ALK PHOS: 57 U/L (ref 38–126)
ALT: 18 U/L (ref 14–54)
ANION GAP: 3 — AB (ref 5–15)
AST: 21 U/L (ref 15–41)
Albumin: 3.1 g/dL — ABNORMAL LOW (ref 3.5–5.0)
BUN: 9 mg/dL (ref 6–20)
CALCIUM: 7.7 mg/dL — AB (ref 8.9–10.3)
CHLORIDE: 107 mmol/L (ref 101–111)
CO2: 27 mmol/L (ref 22–32)
CREATININE: 0.78 mg/dL (ref 0.44–1.00)
GFR calc non Af Amer: >60 mL/min (ref >60)
Glucose, Bld: 121 mg/dL — ABNORMAL HIGH (ref 65–99)
Potassium: 3.1 mmol/L — ABNORMAL LOW (ref 3.5–5.1)
Sodium: 137 mmol/L (ref 135–145)
Total Bilirubin: 0.2 mg/dL — ABNORMAL LOW (ref 0.3–1.2)
Total Protein: 5.7 g/dL — ABNORMAL LOW (ref 6.5–8.1)

## 2016-08-21 LAB — CBC WITH DIFFERENTIAL/PLATELET
Basophils Absolute: 0 10*3/uL (ref 0.0–0.1)
Basophils Relative: 0 %
EOS PCT: 2 %
Eosinophils Absolute: 0.1 10*3/uL (ref 0.0–0.7)
HCT: 31.9 % — ABNORMAL LOW (ref 36.0–46.0)
Hemoglobin: 10.4 g/dL — ABNORMAL LOW (ref 12.0–15.0)
LYMPHS ABS: 1.6 10*3/uL (ref 0.7–4.0)
LYMPHS PCT: 35 %
MCH: 29.9 pg (ref 26.0–34.0)
MCHC: 32.6 g/dL (ref 30.0–36.0)
MCV: 91.7 fL (ref 78.0–100.0)
MONO ABS: 0.6 10*3/uL (ref 0.1–1.0)
MONOS PCT: 13 %
Neutro Abs: 2.2 10*3/uL (ref 1.7–7.7)
Neutrophils Relative %: 50 %
PLATELETS: 127 10*3/uL — AB (ref 150–400)
RBC: 3.48 MIL/uL — AB (ref 3.87–5.11)
RDW: 14.3 % (ref 11.5–15.5)
WBC: 4.5 10*3/uL (ref 4.0–10.5)

## 2016-08-21 LAB — PHOSPHORUS: PHOSPHORUS: 1.5 mg/dL — AB (ref 2.5–4.6)

## 2016-08-21 LAB — URINE CULTURE

## 2016-08-21 LAB — MAGNESIUM: MAGNESIUM: 1.6 mg/dL — AB (ref 1.7–2.4)

## 2016-08-21 MED ORDER — MAGNESIUM SULFATE 2 GM/50ML IV SOLN
2.0000 g | Freq: Once | INTRAVENOUS | Status: AC
  Administered 2016-08-21: 2 g via INTRAVENOUS
  Filled 2016-08-21: qty 50

## 2016-08-21 MED ORDER — SODIUM CHLORIDE 0.9 % IV SOLN
Freq: Once | INTRAVENOUS | Status: AC
  Administered 2016-08-21: 12:00:00 via INTRAVENOUS
  Filled 2016-08-21: qty 1000

## 2016-08-21 MED ORDER — FLEET ENEMA 7-19 GM/118ML RE ENEM
1.0000 | ENEMA | Freq: Once | RECTAL | Status: AC
  Administered 2016-08-21: 1 via RECTAL
  Filled 2016-08-21: qty 1

## 2016-08-21 NOTE — Clinical Social Work Note (Signed)
H&P, FL2, and 30 day note faxed to [facility] for review for PASARR. Will likely not have answer until Thursday due to holiday. MD aware.  Dayton Scrape, [place]

## 2016-08-21 NOTE — Progress Notes (Addendum)
PROGRESS NOTE [facility]   Veronica Fitzgerald   [id number] DOB: 1976/07/03  DOA: 08/17/2016 PCP: No PCP Per Patient   Brief Narrative:  40 y/o F with PMHx of lymphoma in remission, presented to the ED via EMS due to alter mental status. Patient was on her bus ride from [place] to [place] where she was note to be combative and confuse. On arrival to the ED she was found to be encephalopathic. Multiple bottle of sedating medications were found, Zanaflex, Phenergan, benadryl and HCTZ. Apparently patient had visited [facility] and [facility] prior to come to [facility], but no records available. Patient was admitted for AMS which has resolved and abdominal pain.   Subjective: Patient seen and examined. Patient continues to complain of abdominal pain and constipation. Patient has not had a bowel movement in about 1 week. Also c/o N/V but tolerating diet well per nursing staff. No emesis has been wetnessed. Patient also c/o groin and axillar pain on the R side.   Assessment & Plan: Acute Encephalopathy - resolved in the ED after ativan dose. Likely due to sedating agents - Benadryl, Phenergan, Zanaflex CT head negative  No medical records available - despite patient been in health institutions that use epic - Patient is not found in care every where.  ? Hx of psych disorders patient in multiple benzo - will consult psych in AM  PT recommended SNF - unable to get her there at least until after the holidays   Abdominal pain 2/2 to constipation  No signs of acute abdomen  UA negative - urine culture contaminant  KUB show some stools - non obstructive changes  LFTs and Lipase levels normal  Continue bowel regimen - Senna and Miralax  Will add fleet enema  Continue Zofran and Compazine  Advance diet as tolerated   Constipation - likely opioid induced Consider Relistor if no BM with enema   Diffuse B Cell Lymphoma Stage 3 (per patient poor prognosis s/p chemotherapy in 2015, she still have a R  chest port - in remission) - CT Abd/Pelvis no lymphadenopathy See Dr Sedonia Small at [facility]  Pain control with Oxy and Fentanyl - Control reporting system only on Fentanyl back in September  Given holidays unable to contact Dr Tamsen Meek office - will continue to attempt  Bipolar/Anxiety/Depression On multiple Benzo Will consult psych in AM  Patient on Effexor, Lexapro and Klonipin - once records available will resume medications  Lekocytosis - resolved   Hypokalemia/Hypomag  Replete  Check in AM   Normocytic Anemia  Likely due to hemodilution - patient had aggressive hydration  D/c IVF as patient is tolerating well diet If Hb continues to drop will obtain FOB  Monitor CBC in AM   DVT prophylaxis: Heparin Code Status: Full Family Communication: None at bedside Disposition Plan: SNF when bed available   Consultants:   None  Procedures:  EEG 12/20 Impression: This awake and drowsy EEG is normal except for excess amount of diffuse low voltage beta activity.  Clinical Correlation: Diffuse low voltage beta activity is commonly seen with sedating medications such as benzodiazepines.  In the absence of sedating medications, anxiety and hyperthyroidism may produce generalized beta activity.  The absence of epileptiform discharges does not exclude a clinical diagnosis of epilepsy.  If further clinical questions remain, prolonged EEG may be helpful.  Clinical correlation is advised.  Antimicrobials:  None   Objective: Vitals:   08/21/16 0025 08/21/16 0330 08/21/16 1451 08/21/16 1704  BP: 96/60 102/67 Marland Kitchen)  99/53   Pulse:  92 91   Resp:   16   Temp: 99 F (37.2 C) 98.2 F (36.8 C) 99.1 F (37.3 C) 98.7 F (37.1 C)  TempSrc: Oral Oral Oral Oral  SpO2:   99%   Weight:        Intake/Output Summary (Last 24 hours) at 08/21/16 1900 Last data filed at 08/21/16 1801  Gross per 24 hour  Intake          4390.83 ml  Output             2100 ml  Net          2290.83 ml    Filed Weights   08/17/16 2054  Weight: 69.9 kg (154 lb 1.6 oz)    Examination:  General exam: Appears calm and comfortable, No lymphadenopathy  Respiratory system: Clear to auscultation. No wheezes,crackle or rhonchi Cardiovascular system: S1 & S2 heard, RRR. No JVD, murmurs, rubs or gallops Gastrointestinal system: Soft NTND, +BS Central nervous system: Alert and oriented.  Extremities: No pedal edema Skin: No rashes Psychiatry:  Mood & affect flat.    Data Reviewed: I have personally reviewed following labs and imaging studies  CBC:  Recent Labs Lab 08/17/16 1316 08/18/16 0539 08/19/16 1229 08/20/16 0620 08/21/16 0601  WBC 8.1 5.9 12.4* 5.4 4.5  NEUTROABS 5.0  --  9.6* 3.1 2.2  HGB 12.9 13.0 13.7 13.2 10.4*  HCT 38.6 38.9 39.8 40.4 31.9*  MCV 92.3 90.7 90.0 93.3 91.7  PLT 139* 152 175 150 [id number]   Basic Metabolic Panel:  Recent Labs Lab 08/17/16 1316 08/18/16 0539 08/19/16 1229 08/20/16 0620 08/21/16 0601  NA 139 137 137 135 137  K 3.7 3.5 3.2* 3.6 3.1*  CL 107 106 103 104 107  CO2 29 24 23 23 27   GLUCOSE 117* 103* 92 119* 121*  BUN 6 <5* 7 10 9   CREATININE 0.76 0.74 0.80 0.84 0.78  CALCIUM 9.4 8.9 9.8 9.2 7.7*  MG  --   --  1.7 1.7 1.6*  PHOS  --   --  6.0* 3.3 1.5*   GFR: CrCl cannot be calculated (Unknown ideal weight.). Liver Function Tests:  Recent Labs Lab 08/17/16 1316 08/19/16 1229 08/20/16 0620 08/21/16 0601  AST 31 31 30 21   ALT 29 28 27 18   ALKPHOS 70 62 55 57  BILITOT 0.5 1.6* 1.1 0.2*  PROT 7.6 7.9 7.7 5.7*  ALBUMIN 4.1 4.4 4.2 3.1*    Recent Labs Lab 08/18/16 0853  LIPASE 14   No results for input(s): AMMONIA in the last 168 hours. Coagulation Profile: No results for input(s): INR, PROTIME in the last 168 hours. Cardiac Enzymes: No results for input(s): CKTOTAL, CKMB, CKMBINDEX, TROPONINI in the last 168 hours. BNP (last 3 results) No results for input(s): PROBNP in the last 8760 hours. HbA1C: No results for  input(s): HGBA1C in the last 72 hours. CBG: No results for input(s): GLUCAP in the last 168 hours. Lipid Profile: No results for input(s): CHOL, HDL, LDLCALC, TRIG, CHOLHDL, LDLDIRECT in the last 72 hours. Thyroid Function Tests: No results for input(s): TSH, T4TOTAL, FREET4, T3FREE, THYROIDAB in the last 72 hours. Anemia Panel: No results for input(s): VITAMINB12, FOLATE, FERRITIN, TIBC, IRON, RETICCTPCT in the last 72 hours. Sepsis Labs: No results for input(s): PROCALCITON, LATICACIDVEN in the last 168 hours.  Recent Results (from the past 240 hour(s))  Culture, Urine     Status: Abnormal   Collection Time: 08/20/16  3:30  PM  Result Value Ref Range Status   Specimen Description URINE, RANDOM  Final   Special Requests NONE  Final   Culture MULTIPLE SPECIES PRESENT, SUGGEST RECOLLECTION (A)  Final   Report Status 08/21/2016 FINAL  Final         Radiology Studies: No results found.    Scheduled Meds: . fentaNYL  12.5 mcg Transdermal Q72H  . folic acid  1 mg Oral Daily  . heparin  5,000 Units Subcutaneous Q8H  . multivitamin with minerals  1 tablet Oral Daily  . pantoprazole  40 mg Oral Daily  . polyethylene glycol  17 g Oral Daily  . senna-docusate  1 tablet Oral BID  . sodium chloride flush  3 mL Intravenous Q12H  . sucralfate  1 g Oral TID WC & HS  . thiamine  100 mg Oral Daily   Continuous Infusions: . sodium chloride 100 mL/hr at 08/21/16 0506     LOS: 3 days    Time spent: Total of  65 minutes spent with pt, greater than 50% of which was spent in discussion of  treatment, counseling and coordination of care    Chipper Oman, MD [facility] Pager [phone]  If 7PM-7AM, please contact night-coverage [email or web address] Password [email or web address] 08/21/2016, 7:00 PM

## 2016-08-22 LAB — BASIC METABOLIC PANEL
Anion gap: 4 — ABNORMAL LOW (ref 5–15)
BUN: 6 mg/dL (ref 6–20)
CALCIUM: 7.9 mg/dL — AB (ref 8.9–10.3)
CHLORIDE: 108 mmol/L (ref 101–111)
CO2: 24 mmol/L (ref 22–32)
CREATININE: 0.54 mg/dL (ref 0.44–1.00)
GFR calc Af Amer: >60 mL/min (ref >60)
GFR calc non Af Amer: >60 mL/min (ref >60)
GLUCOSE: 109 mg/dL — AB (ref 65–99)
Potassium: 3.7 mmol/L (ref 3.5–5.1)
Sodium: 136 mmol/L (ref 135–145)

## 2016-08-22 LAB — CBC
HCT: 31.5 % — ABNORMAL LOW (ref 36.0–46.0)
Hemoglobin: 10.4 g/dL — ABNORMAL LOW (ref 12.0–15.0)
MCH: 30.5 pg (ref 26.0–34.0)
MCHC: 33 g/dL (ref 30.0–36.0)
MCV: 92.4 fL (ref 78.0–100.0)
PLATELETS: 118 10*3/uL — AB (ref 150–400)
RBC: 3.41 MIL/uL — AB (ref 3.87–5.11)
RDW: 14.4 % (ref 11.5–15.5)
WBC: 4.1 10*3/uL (ref 4.0–10.5)

## 2016-08-22 LAB — MAGNESIUM: Magnesium: 1.8 mg/dL (ref 1.7–2.4)

## 2016-08-22 NOTE — ED Notes (Signed)
Both 1mg  doses of Ativan given to this patient during her ED encounter were done while the patient was being extremely combative and attempting to get out of bed.  Administration and the waste of both doses was observed by Doristine Mango RN.

## 2016-08-22 NOTE — Progress Notes (Signed)
PROGRESS NOTE [facility]   Pranjal Sobotka   [id number] DOB: July 05, 1976  DOA: 08/17/2016 PCP: No PCP Per Patient   Brief Narrative:  40 y/o F with PMHx of lymphoma in remission, presented to the ED via EMS due to alter mental status. Patient was on her bus ride from [place] to [place] where she was note to be combative and confuse. On arrival to the ED she was found to be encephalopathic. Multiple bottle of sedating medications were found, Zanaflex, Phenergan, benadryl and HCTZ. Apparently patient had visited [place] and  med prior to come to [facility], but no records available. Patient was admitted for AMS which has resolved and abdominal pain.   Subjective: Patient seen and examined with nurse at bedside. Patient continues to c/o nausea and vomiting but no witnesses of emesis, bag is always empty. Patient still very week. Awaiting for nursing home placement    Assessment & Plan: Acute Encephalopathy - resolved in the ED after ativan dose. No changes  Likely due to sedating agents - Benadryl, Phenergan, Zanaflex CT head negative  No medical records available - despite patient been in health institutions that use epic - Patient is not found in care every where.  EEG consistent with sedative medications  ? Hx of psych disorders patient in multiple benzo - PT recommended SNF - unable to get her there at least until after the holidays   Abdominal pain 2/2 to constipation - had a bowel movement after fleet enema. - pain improved  No signs of acute abdomen  UA negative - urine culture contaminant  KUB show some stools - non obstructive changes  LFTs and Lipase levels normal  Continue bowel regimen - Senna and Miralax  Continue Zofran and Compazine  On regular diet   Constipation - likely opioid induced - Had BM last night  Continue to monitor   Diffuse B Cell Lymphoma Stage 3 (per patient poor prognosis s/p chemotherapy in 2015, she still have a R chest port - in remission)  - CT Abd/Pelvis no lymphadenopathy See Dr Sedonia Small at [facility]  Pain control with Oxy and Fentanyl - Control reporting system only on Fentanyl back in September with no refills  Given holidays unable to contact Dr Tamsen Meek office - awaiting for records  Bipolar/Anxiety/Depression Psych consulted  On multiple Benzo Patient on Effexor, Lexapro and Klonipin - once records available will resume medications  Lekocytosis - resolved   Hypokalemia/Hypomag - Resolved  Monitor    Normocytic Anemia - stable  Likely due to hemodilution - patient had aggressive hydration  D/c IVF as patient is tolerating well diet If Hb continues to drop will obtain FOB  Transfuses if Hbg < 7  Discussed with patient the lack of consistent information regarding medications, controlled substance registry is not consistent with what she claims that she is taking. Now she changes her story stating that she just go her from [place] her she was given her prescription. Opioid was negative in the urine.   DVT prophylaxis: Heparin Code Status: Full Family Communication: None at bedside Disposition Plan: SNF when bed available   Consultants:   None  Procedures:  EEG 12/20 Impression: This awake and drowsy EEG is normal except for excess amount of diffuse low voltage beta activity.  Clinical Correlation: Diffuse low voltage beta activity is commonly seen with sedating medications such as benzodiazepines.  In the absence of sedating medications, anxiety and hyperthyroidism may produce generalized beta activity.  The absence of epileptiform discharges does not  exclude a clinical diagnosis of epilepsy.  If further clinical questions remain, prolonged EEG may be helpful.  Clinical correlation is advised.  Antimicrobials:  None   Objective: Vitals:   08/21/16 1704 08/21/16 2125 08/22/16 0452 08/22/16 1421  BP:  (!) 97/49 101/65 101/62  Pulse:  97 89 92  Resp:  16 16 16   Temp: 98.7 F (37.1 C) 98.5 F  (36.9 C) 98.4 F (36.9 C) 98.1 F (36.7 C)  TempSrc: Oral Oral Oral Oral  SpO2:  99% 100% 100%  Weight:        Intake/Output Summary (Last 24 hours) at 08/22/16 1613 Last data filed at 08/22/16 1514  Gross per 24 hour  Intake          1470.83 ml  Output             2350 ml  Net          -879.17 ml   Filed Weights   08/17/16 2054  Weight: 69.9 kg (154 lb 1.6 oz)    Examination:  General exam: NAD, No lymphadenopathy  Respiratory system: CTA  Cardiovascular system: S1 & S2 heard, RRR. No JVD, murmurs, rubs or gallops Gastrointestinal system: Soft Mild tenderness in the LLQ, +BS Central nervous system: Alert and oriented.  Extremities: No pedal edema Psychiatry:  Mood & affect flat.    Data Reviewed: I have personally reviewed following labs and imaging studies  CBC:  Recent Labs Lab 08/17/16 1316 08/18/16 0539 08/19/16 1229 08/20/16 0620 08/21/16 0601 08/22/16 0446  WBC 8.1 5.9 12.4* 5.4 4.5 4.1  NEUTROABS 5.0  --  9.6* 3.1 2.2  --   HGB 12.9 13.0 13.7 13.2 10.4* 10.4*  HCT 38.6 38.9 39.8 40.4 31.9* 31.5*  MCV 92.3 90.7 90.0 93.3 91.7 92.4  PLT 139* 152 175 150 127* [id number]*   Basic Metabolic Panel:  Recent Labs Lab 08/18/16 0539 08/19/16 1229 08/20/16 0620 08/21/16 0601 08/22/16 0446  NA 137 137 135 137 136  K 3.5 3.2* 3.6 3.1* 3.7  CL 106 103 104 107 108  CO2 24 23 23 27 24   GLUCOSE 103* 92 119* 121* 109*  BUN <5* 7 10 9 6   CREATININE 0.74 0.80 0.84 0.78 0.54  CALCIUM 8.9 9.8 9.2 7.7* 7.9*  MG  --  1.7 1.7 1.6* 1.8  PHOS  --  6.0* 3.3 1.5*  --    GFR: CrCl cannot be calculated (Unknown ideal weight.). Liver Function Tests:  Recent Labs Lab 08/17/16 1316 08/19/16 1229 08/20/16 0620 08/21/16 0601  AST 31 31 30 21   ALT 29 28 27 18   ALKPHOS 70 62 55 57  BILITOT 0.5 1.6* 1.1 0.2*  PROT 7.6 7.9 7.7 5.7*  ALBUMIN 4.1 4.4 4.2 3.1*    Recent Labs Lab 08/18/16 0853  LIPASE 14   No results for input(s): AMMONIA in the last 168  hours. Coagulation Profile: No results for input(s): INR, PROTIME in the last 168 hours. Cardiac Enzymes: No results for input(s): CKTOTAL, CKMB, CKMBINDEX, TROPONINI in the last 168 hours. BNP (last 3 results) No results for input(s): PROBNP in the last 8760 hours. HbA1C: No results for input(s): HGBA1C in the last 72 hours. CBG: No results for input(s): GLUCAP in the last 168 hours. Lipid Profile: No results for input(s): CHOL, HDL, LDLCALC, TRIG, CHOLHDL, LDLDIRECT in the last 72 hours. Thyroid Function Tests: No results for input(s): TSH, T4TOTAL, FREET4, T3FREE, THYROIDAB in the last 72 hours. Anemia Panel: No results for input(s): VITAMINB12, FOLATE,  FERRITIN, TIBC, IRON, RETICCTPCT in the last 72 hours. Sepsis Labs: No results for input(s): PROCALCITON, LATICACIDVEN in the last 168 hours.  Recent Results (from the past 240 hour(s))  Culture, Urine     Status: Abnormal   Collection Time: 08/20/16  3:30 PM  Result Value Ref Range Status   Specimen Description URINE, RANDOM  Final   Special Requests NONE  Final   Culture MULTIPLE SPECIES PRESENT, SUGGEST RECOLLECTION (A)  Final   Report Status 08/21/2016 FINAL  Final     Radiology Studies: No results found.    Scheduled Meds: . fentaNYL  12.5 mcg Transdermal Q72H  . folic acid  1 mg Oral Daily  . heparin  5,000 Units Subcutaneous Q8H  . multivitamin with minerals  1 tablet Oral Daily  . pantoprazole  40 mg Oral Daily  . polyethylene glycol  17 g Oral Daily  . senna-docusate  1 tablet Oral BID  . sodium chloride flush  3 mL Intravenous Q12H  . sucralfate  1 g Oral TID WC & HS  . thiamine  100 mg Oral Daily   Continuous Infusions:    LOS: 4 days    Chipper Oman, MD [facility] Pager [phone]  If 7PM-7AM, please contact night-coverage [email or web address] Password [facility] 08/22/2016, 4:13 PM

## 2016-08-23 ENCOUNTER — Encounter (HOSPITAL_COMMUNITY): Payer: Self-pay

## 2016-08-23 DIAGNOSIS — R109 Unspecified abdominal pain: Secondary | ICD-10-CM

## 2016-08-23 DIAGNOSIS — Z801 Family history of malignant neoplasm of trachea, bronchus and lung: Secondary | ICD-10-CM

## 2016-08-23 DIAGNOSIS — Z803 Family history of malignant neoplasm of breast: Secondary | ICD-10-CM

## 2016-08-23 DIAGNOSIS — Z9889 Other specified postprocedural states: Secondary | ICD-10-CM

## 2016-08-23 DIAGNOSIS — Z79891 Long term (current) use of opiate analgesic: Secondary | ICD-10-CM

## 2016-08-23 DIAGNOSIS — Z9049 Acquired absence of other specified parts of digestive tract: Secondary | ICD-10-CM

## 2016-08-23 DIAGNOSIS — Z808 Family history of malignant neoplasm of other organs or systems: Secondary | ICD-10-CM

## 2016-08-23 DIAGNOSIS — Z90710 Acquired absence of both cervix and uterus: Secondary | ICD-10-CM

## 2016-08-23 DIAGNOSIS — Z87891 Personal history of nicotine dependence: Secondary | ICD-10-CM

## 2016-08-23 DIAGNOSIS — Z79899 Other long term (current) drug therapy: Secondary | ICD-10-CM

## 2016-08-23 MED ORDER — PROMETHAZINE HCL 25 MG/ML IJ SOLN
25.0000 mg | Freq: Four times a day (QID) | INTRAMUSCULAR | Status: DC | PRN
  Administered 2016-08-23 – 2016-08-25 (×5): 25 mg via INTRAVENOUS
  Filled 2016-08-23 (×6): qty 1

## 2016-08-23 MED ORDER — FAMOTIDINE IN NACL 20-0.9 MG/50ML-% IV SOLN
20.0000 mg | Freq: Two times a day (BID) | INTRAVENOUS | Status: DC
  Filled 2016-08-23: qty 50

## 2016-08-23 MED ORDER — FAMOTIDINE 20 MG PO TABS
20.0000 mg | ORAL_TABLET | Freq: Two times a day (BID) | ORAL | Status: DC
  Administered 2016-08-23 – 2016-08-25 (×4): 20 mg via ORAL
  Filled 2016-08-23 (×5): qty 1

## 2016-08-23 MED ORDER — SODIUM CHLORIDE 0.9 % IV BOLUS (SEPSIS)
500.0000 mL | Freq: Once | INTRAVENOUS | Status: AC
  Administered 2016-08-23: 500 mL via INTRAVENOUS

## 2016-08-23 NOTE — Progress Notes (Signed)
PROGRESS NOTE [facility]   Harshini Vanwingerden   [id number] DOB: Sep 22, 1975  DOA: 08/17/2016 PCP: No PCP Per Patient   Brief Narrative:  40 y/o F with PMHx of lymphoma in remission, presented to the ED via EMS due to alter mental status. Patient was on her bus ride from [place] to [place] where she was note to be combative and confuse. On arrival to the ED she was found to be encephalopathic. Multiple bottle of sedating medications were found, Zanaflex, Phenergan, benadryl and HCTZ. Apparently patient had visited [place] [facility] [place] med prior to come to [facility], but no records available. Patient was admitted for AMS which has resolved and abdominal pain.   Subjective: Doing well, no new complaints, continues to report vomiting but no witnessed emesis or residual in the bag despite telling the patient to keep it if she vomits. Reports that phenergan helps    Assessment & Plan: No Changes in PLAN patient awaiting SFN placement   Acute Encephalopathy - resolved in the ED after ativan dose.  Likely due to sedating agents - Benadryl, Phenergan, Zanaflex CT head negative  No medical records available - despite patient been in health institutions that use epic - Patient is not found in care every where.  EEG consistent with sedative medications  ? Hx of psych disorders patient in multiple benzo - PT recommended SNF due generalize weakness - unable to get her there at least until after the holidays   Abdominal pain 2/2 to constipation - had a bowel movement after fleet enema. - pain improved No signs of acute abdomen  UA negative - urine culture contaminant  KUB show some stools - non obstructive changes  LFTs and Lipase levels normal  Continue bowel regimen - Senna and Miralax  Continue Zofran and Compazine  On regular diet   Constipation - likely opioid induced - Improved  Continue to monitor   Diffuse B Cell Lymphoma Stage 3 (per patient poor prognosis s/p chemotherapy in 2015, she  still have a R chest port - in remission) - CT Abd/Pelvis no lymphadenopathy See Dr Sedonia Small at [facility]  Pain control with Oxy and Fentanyl - Control reporting system only on Fentanyl back in September with no refills  Given holidays unable to contact Dr Tamsen Meek office - awaiting for records  Bipolar/Anxiety/Depression Psych consulted for medication indication  Polypharmacy, on multiple antidepressant Effexor, Lexapro, Klonopin, multiple benzos Patient with no SI/HI and no psychotic symptoms   Lekocytosis - resolved   Hypokalemia/Hypomag - Resolved  Monitor    Normocytic Anemia - stable  Likely due to hemodilution - patient had aggressive hydration  D/c IVF as patient is tolerating well diet Transfuses if Hbg < 7  Discussed with patient the lack of consistent information regarding medications, controlled substance registry is not consistent with what she claims that she is taking. Now she changes her story stating that she just go here from [place] her she was given her prescriptions. Opioid was negative in the urine.   DVT prophylaxis: Heparin Code Status: Full Family Communication: None at bedside Disposition Plan: SNF when bed available, Awaiting for [facility]   Consultants:   None  Procedures:  EEG 12/20 Impression: This awake and drowsy EEG is normal except for excess amount of diffuse low voltage beta activity.  Clinical Correlation: Diffuse low voltage beta activity is commonly seen with sedating medications such as benzodiazepines.  In the absence of sedating medications, anxiety and hyperthyroidism may produce generalized beta activity.  The absence  of epileptiform discharges does not exclude a clinical diagnosis of epilepsy.  If further clinical questions remain, prolonged EEG may be helpful.  Clinical correlation is advised.  Antimicrobials:  None   Objective: Vitals:   08/22/16 0452 08/22/16 1421 08/22/16 2229 08/23/16 0525  BP: 101/65 101/62 105/66  111/72  Pulse: 89 92 98 90  Resp: 16 16 18 18   Temp: 98.4 F (36.9 C) 98.1 F (36.7 C) 98.2 F (36.8 C) 98.3 F (36.8 C)  TempSrc: Oral Oral Oral Oral  SpO2: 100% 100% 100% 100%  Weight:      Height:    5\' 8"  (1.727 m)    Intake/Output Summary (Last 24 hours) at 08/23/16 1207 Last data filed at 08/23/16 [id number]  Gross per 24 hour  Intake                0 ml  Output             3450 ml  Net            -3450 ml   Filed Weights   08/17/16 2054  Weight: 69.9 kg (154 lb 1.6 oz)    Examination:  General exam: Happy and joyful NAD, No lymphadenopathy  Cardiovascular system: S1S2 no murmurs  Gastrointestinal system: Sof NDNT  Psychiatry:  Mood & affect flat.    Data Reviewed: I have personally reviewed following labs and imaging studies  CBC:  Recent Labs Lab 08/17/16 1316 08/18/16 0539 08/19/16 1229 08/20/16 0620 08/21/16 0601 08/22/16 0446  WBC 8.1 5.9 12.4* 5.4 4.5 4.1  NEUTROABS 5.0  --  9.6* 3.1 2.2  --   HGB 12.9 13.0 13.7 13.2 10.4* 10.4*  HCT 38.6 38.9 39.8 40.4 31.9* 31.5*  MCV 92.3 90.7 90.0 93.3 91.7 92.4  PLT 139* 152 175 150 127* 123456*   Basic Metabolic Panel:  Recent Labs Lab 08/18/16 0539 08/19/16 1229 08/20/16 0620 08/21/16 0601 08/22/16 0446  NA 137 137 135 137 136  K 3.5 3.2* 3.6 3.1* 3.7  CL 106 103 104 107 108  CO2 24 23 23 27 24   GLUCOSE 103* 92 119* 121* 109*  BUN <5* 7 10 9 6   CREATININE 0.74 0.80 0.84 0.78 0.54  CALCIUM 8.9 9.8 9.2 7.7* 7.9*  MG  --  1.7 1.7 1.6* 1.8  PHOS  --  6.0* 3.3 1.5*  --    GFR: Estimated Creatinine Clearance: 94.3 mL/min (by C-G formula based on SCr of 0.54 mg/dL). Liver Function Tests:  Recent Labs Lab 08/17/16 1316 08/19/16 1229 08/20/16 0620 08/21/16 0601  AST 31 31 30 21   ALT 29 28 27 18   ALKPHOS 70 62 55 57  BILITOT 0.5 1.6* 1.1 0.2*  PROT 7.6 7.9 7.7 5.7*  ALBUMIN 4.1 4.4 4.2 3.1*    Recent Labs Lab 08/18/16 0853  LIPASE 14   No results for input(s): AMMONIA in the last 168  hours. Coagulation Profile: No results for input(s): INR, PROTIME in the last 168 hours. Cardiac Enzymes: No results for input(s): CKTOTAL, CKMB, CKMBINDEX, TROPONINI in the last 168 hours. BNP (last 3 results) No results for input(s): PROBNP in the last 8760 hours. HbA1C: No results for input(s): HGBA1C in the last 72 hours. CBG: No results for input(s): GLUCAP in the last 168 hours. Lipid Profile: No results for input(s): CHOL, HDL, LDLCALC, TRIG, CHOLHDL, LDLDIRECT in the last 72 hours. Thyroid Function Tests: No results for input(s): TSH, T4TOTAL, FREET4, T3FREE, THYROIDAB in the last 72 hours. Anemia Panel: No  results for input(s): VITAMINB12, FOLATE, FERRITIN, TIBC, IRON, RETICCTPCT in the last 72 hours. Sepsis Labs: No results for input(s): PROCALCITON, LATICACIDVEN in the last 168 hours.  Recent Results (from the past 240 hour(s))  Culture, Urine     Status: Abnormal   Collection Time: 08/20/16  3:30 PM  Result Value Ref Range Status   Specimen Description URINE, RANDOM  Final   Special Requests NONE  Final   Culture MULTIPLE SPECIES PRESENT, SUGGEST RECOLLECTION (A)  Final   Report Status 08/21/2016 FINAL  Final     Radiology Studies: No results found.    Scheduled Meds: . fentaNYL  12.5 mcg Transdermal Q72H  . folic acid  1 mg Oral Daily  . heparin  5,000 Units Subcutaneous Q8H  . multivitamin with minerals  1 tablet Oral Daily  . pantoprazole  40 mg Oral Daily  . polyethylene glycol  17 g Oral Daily  . senna-docusate  1 tablet Oral BID  . sodium chloride flush  3 mL Intravenous Q12H  . sucralfate  1 g Oral TID WC & HS  . thiamine  100 mg Oral Daily   Continuous Infusions:    LOS: 5 days    Chipper Oman, MD [facility] Pager [phone]  If 7PM-7AM, please contact night-coverage [email or web address] Password [facility] 08/23/2016, 12:07 PM
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
## 2016-08-24 ENCOUNTER — Encounter (HOSPITAL_COMMUNITY): Payer: Self-pay | Admitting: General Practice

## 2016-08-24 DIAGNOSIS — F419 Anxiety disorder, unspecified: Secondary | ICD-10-CM

## 2016-08-24 MED ORDER — QUETIAPINE FUMARATE ER 50 MG PO TB24
50.0000 mg | ORAL_TABLET | Freq: Every day | ORAL | Status: DC
  Administered 2016-08-24: 50 mg via ORAL
  Filled 2016-08-24: qty 1

## 2016-08-24 MED ORDER — KETOROLAC TROMETHAMINE 30 MG/ML IJ SOLN: 30.0000 mg | Freq: Four times a day (QID) | INTRAMUSCULAR | Status: DC | PRN

## 2016-08-24 MED ORDER — ACETAMINOPHEN 500 MG PO TABS: 1000.0000 mg | ORAL_TABLET | ORAL | Status: DC | PRN

## 2016-08-24 MED ORDER — MAGIC MOUTHWASH W/LIDOCAINE: 5.0000 mL | Freq: Three times a day (TID) | ORAL | Status: DC | PRN

## 2016-08-24 MED ORDER — CLONAZEPAM 0.5 MG PO TABS
0.5000 mg | ORAL_TABLET | Freq: Two times a day (BID) | ORAL | Status: DC
  Administered 2016-08-24 – 2016-08-25 (×2): 0.5 mg via ORAL
  Filled 2016-08-24 (×2): qty 1

## 2016-08-24 MED ORDER — OXYCODONE HCL 5 MG PO TABS
10.0000 mg | ORAL_TABLET | Freq: Two times a day (BID) | ORAL | Status: DC | PRN
  Administered 2016-08-25: 10 mg via ORAL
  Filled 2016-08-24: qty 2

## 2016-08-24 NOTE — Progress Notes (Signed)
Pt. Requesting pain medications, informed pt. That frequency of medication has been change and is not able to have until 1230 am.  Informed pt. That she has toradol and tylenol order also.  Pt. Stated that she is allergic to toradol and unable to take tylenol because of her liver.  Text paged night coverage, will await for new orders or return call and continue to monitor.  Alphonzo Lemmings, RN

## 2016-08-24 NOTE — Plan of Care (Signed)
Update Outside chart reviewed  Records reviewed from [facility]  Patient was seen on 07/07/16 Patient has been having increasing anxiety due to thoughts of recurrent cancer. Patient had a PET scan on 06/1716. Which was negative Source of abdominal pain thought to be anxiety as she discussed with her primary care doctor, agreed upon discontinuing opiate medication. Oxycodone OxyContin and fentanyl were discontinued.  Discontinue fentanyl, taper oxy will switch to q 12 hours when necessary, we'll add Tylenol and Toradol for pain Patient started on Seroquel per psychiatry and Klonopin 0.5 twice a day for anxiety.  Plan will be continued to discharge to SNF when PASARR evaluation is done.   Chipper Oman, MD

## 2016-08-24 NOTE — Care Management Note (Addendum)
Case Management Note  Patient Details  Name: Gayla Carew MRN: [id number] Date of Birth: 1976-01-10  Subjective/Objective:                 Patient admitted with encephalopathy. PT eval today shows patient requiring SNF due to inability to ambulate. Patient without home support. Awaiting PASARR   Action/Plan:  Continue to monitor patient's ability to ambulate. PASARR needed for SNF placement. 12/27 PASSAR obtained, will DC to SNF today.  Expected Discharge Date:                  Expected Discharge Plan:  Skilled Nursing Facility  In-House Referral:  Clinical Social Work  Discharge planning Services  CM Consult  Post Acute Care Choice:    Choice offered to:     DME Arranged:    DME Agency:     HH Arranged:    [place] Agency:     Status of Service:  In process, will continue to follow  If discussed at Long Length of Stay Meetings, dates discussed:    Additional Comments:  Carles Collet, RN 08/24/2016, 4:16 PM

## 2016-08-24 NOTE — Care Management Important Message (Signed)
Important Message  Patient Details  Name: Veronica Fitzgerald MRN: [id number] Date of Birth: 1975/12/24   Medicare Important Message Given:  Yes    Orbie Pyo 08/24/2016, 2:13 PM

## 2016-08-24 NOTE — Progress Notes (Signed)
PROGRESS NOTE [facility]   Veronica Fitzgerald   [id number] DOB: 20-Oct-1975  DOA: 08/17/2016 PCP: No PCP Per Patient   Brief Narrative:  40 y/o F with PMHx of lymphoma in remission, presented to the ED via EMS due to alter mental status. Patient was on her bus ride from [place] to [place] where she was note to be combative and confuse. On arrival to the ED she was found to be encephalopathic. Multiple bottle of sedating medications were found, Zanaflex, Phenergan, benadryl and HCTZ. Apparently patient had visited [facility] and [facility] med prior to come to [facility], but no records available. Patient was admitted for AMS which has resolved and abdominal pain.   Subjective: Patient continues to have N/V but tolerates well food when phenergan added. Patient asking for more oxycodone. For abdominal pain "all over"    Assessment & Plan: No Changes in PLAN patient awaiting SFN placement   Acute Encephalopathy - resolved in the ED after ativan dose.   Likely due to sedating agents - Benadryl, Phenergan, Zanaflex CT head negative  No medical records available - despite patient been in health institutions that use epic - Patient is not found in care every where.  EEG consistent with sedative medications  PT recommended SNF due generalize weakness - Awaiting for PASSAR evaluation - patient continues to not be able to ambulate well.    Abdominal pain 2/2 to constipation - having regular bowel movement now, but pain continues No signs of acute abdomen  UA negative - urine culture contaminant  KUB show some stools - non obstructive changes  LFTs and Lipase levels normal  Continue bowel regimen - Senna and Miralax  Continue Zofran and Compazine  Toradol added PRN  On regular diet   Constipation - likely opioid induced - Improved  Continue to monitor  Consider Relistor if no bowel movements   Diffuse B Cell Lymphoma Stage 3 (per patient poor prognosis s/p chemotherapy in 2015, she still have a  R chest port - in remission) - CT Abd/Pelvis no lymphadenopathy See Dr Sedonia [facility] at [facility]  Pain control with Oxy and Fentanyl - Control reporting system only on Fentanyl back in September with no refills  Awaiting for Dr Allene Dillon and DNP [facility]'s  records  Bipolar/Anxiety/Depression Psych recommendations appreciated  Psych cleared for discharge  Start Seroquel 50mg  q HS  D/c Xanax given higher risk for dependence, Start Klonopin 0.5mg  BID  Patient with no SI/HI and no psychotic symptoms   Lekocytosis - resolved   Hypokalemia/Hypomag - Resolved  Monitor    Normocytic Anemia - stable  Likely due to hemodilution - patient had aggressive hydration  D/c IVF as patient is tolerating well diet Transfuses if Hbg < 7  Patient continues to have inconsistent reports, patient asking for more oxy, when told that has not been prescribed in the last 6 month she report that it was given to her in a hospital in [place]. Also asking to use her chemo port instead of peripheral vein. When she report been in remission and no chemotherapy, but when told that is not safe to access the port without knowing if is functional she state "I had chemo 2 weeks ago with Dr. Sedonia Small" again no records available. She was also inconsistent with psych evaluation.   DVT prophylaxis: Heparin Code Status: Full Family Communication: None at bedside Disposition Plan: SNF when bed available, Awaiting for PASSAR   Consultants:   None  Procedures:  EEG 12/20 Impression: This awake and drowsy  EEG is normal except for excess amount of diffuse low voltage beta activity.  Clinical Correlation: Diffuse low voltage beta activity is commonly seen with sedating medications such as benzodiazepines.  In the absence of sedating medications, anxiety and hyperthyroidism may produce generalized beta activity.  The absence of epileptiform discharges does not exclude a clinical diagnosis of epilepsy.  If further clinical  questions remain, prolonged EEG may be helpful.  Clinical correlation is advised.  Antimicrobials:  None   Objective: Vitals:   08/23/16 1554 08/23/16 2151 08/24/16 0633 08/24/16 1519  BP: (!) 131/112 115/80 92/68 117/80  Pulse: (!) 122 98 86 98  Resp: 18 18 16 20   Temp: 98.6 F (37 C) 98.1 F (36.7 C) 97.5 F (36.4 C) 98.2 F (36.8 C)  TempSrc: Oral Oral Oral Oral  SpO2: 100% 100% 100% 100%  Weight:      Height:        Intake/Output Summary (Last 24 hours) at 08/24/16 1550 Last data filed at 08/24/16 1203  Gross per 24 hour  Intake          1411.67 ml  Output             1900 ml  Net          -488.33 ml   Filed Weights   08/17/16 2054  Weight: 69.9 kg (154 lb 1.6 oz)    Examination:  General exam: NAD Cardiovascular system: S1S2 no murmurs  Gastrointestinal system: Soft , tender to palpation but, when press with sthethoscope no pain elicited. Groin with no lymphadenopathy.  Skin: chemo port r chest   Data Reviewed: I have personally reviewed following labs and imaging studies  CBC:  Recent Labs Lab 08/18/16 0539 08/19/16 1229 08/20/16 0620 08/21/16 0601 08/22/16 0446  WBC 5.9 12.4* 5.4 4.5 4.1  NEUTROABS  --  9.6* 3.1 2.2  --   HGB 13.0 13.7 13.2 10.4* 10.4*  HCT 38.9 39.8 40.4 31.9* 31.5*  MCV 90.7 90.0 93.3 91.7 92.4  PLT 152 175 150 127* [id number]*   Basic Metabolic Panel:  Recent Labs Lab 08/18/16 0539 08/19/16 1229 08/20/16 0620 08/21/16 0601 08/22/16 0446  NA 137 137 135 137 136  K 3.5 3.2* 3.6 3.1* 3.7  CL 106 103 104 107 108  CO2 24 23 23 27 24   GLUCOSE 103* 92 119* 121* 109*  BUN <5* 7 10 9 6   CREATININE 0.74 0.80 0.84 0.78 0.54  CALCIUM 8.9 9.8 9.2 7.7* 7.9*  MG  --  1.7 1.7 1.6* 1.8  PHOS  --  6.0* 3.3 1.5*  --    GFR: Estimated Creatinine Clearance: 94.3 mL/min (by C-G formula based on SCr of 0.54 mg/dL). Liver Function Tests:  Recent Labs Lab 08/19/16 1229 08/20/16 0620 08/21/16 0601  AST 31 30 21   ALT 28 27 18     ALKPHOS 62 55 57  BILITOT 1.6* 1.1 0.2*  PROT 7.9 7.7 5.7*  ALBUMIN 4.4 4.2 3.1*    Recent Labs Lab 08/18/16 0853  LIPASE 14   No results for input(s): AMMONIA in the last 168 hours. Coagulation Profile: No results for input(s): INR, PROTIME in the last 168 hours. Cardiac Enzymes: No results for input(s): CKTOTAL, CKMB, CKMBINDEX, TROPONINI in the last 168 hours. BNP (last 3 results) No results for input(s): PROBNP in the last 8760 hours. HbA1C: No results for input(s): HGBA1C in the last 72 hours. CBG: No results for input(s): GLUCAP in the last 168 hours. Lipid Profile: No results for input(s):  CHOL, HDL, LDLCALC, TRIG, CHOLHDL, LDLDIRECT in the last 72 hours. Thyroid Function Tests: No results for input(s): TSH, T4TOTAL, FREET4, T3FREE, THYROIDAB in the last 72 hours. Anemia Panel: No results for input(s): VITAMINB12, FOLATE, FERRITIN, TIBC, IRON, RETICCTPCT in the last 72 hours. Sepsis Labs: No results for input(s): PROCALCITON, LATICACIDVEN in the last 168 hours.  Recent Results (from the past 240 hour(s))  Culture, Urine     Status: Abnormal   Collection Time: 08/20/16  3:30 PM  Result Value Ref Range Status   Specimen Description URINE, RANDOM  Final   Special Requests NONE  Final   Culture MULTIPLE SPECIES PRESENT, SUGGEST RECOLLECTION (A)  Final   Report Status 08/21/2016 FINAL  Final     Radiology Studies: No results found.   Scheduled Meds: . clonazePAM  0.5 mg Oral BID  . famotidine  20 mg Oral BID  . fentaNYL  12.5 mcg Transdermal Q72H  . folic acid  1 mg Oral Daily  . heparin  5,000 Units Subcutaneous Q8H  . multivitamin with minerals  1 tablet Oral Daily  . polyethylene glycol  17 g Oral Daily  . QUEtiapine  50 mg Oral QHS  . senna-docusate  1 tablet Oral BID  . sodium chloride flush  3 mL Intravenous Q12H  . sucralfate  1 g Oral TID WC & HS  . thiamine  100 mg Oral Daily   Continuous Infusions:    LOS: 6 days    Chipper Oman, MD [facility] Pager [phone]  If 7PM-7AM, please contact night-coverage [email or web address] Password [email or web address] 08/24/2016, 3:50 PM

## 2016-08-24 NOTE — Progress Notes (Signed)
Physical Therapy Treatment Patient Details Name: Veronica Fitzgerald MRN: [id number] DOB: 1975-10-04 Today's Date: 08/24/2016    History of Present Illness 40 y.o. female, Who presents via EMS secondary to altered mental status  in the setting of acute encephalopathy.    PT Comments    Pt declines OOB due to weakness but is asking also about impending SNF stay.  Her plan is to discharge to SNF when a bed is available, has no clear plan yet as PASSAR number has not yet arrived to commit the stay.  Will follow and expect 2 person assist next visit to stand and walk as needed.  Follow Up Recommendations  SNF     Equipment Recommendations  None recommended by PT    Recommendations for Other Services       Precautions / Restrictions Precautions Precautions: Fall Precaution Comments: pt describes falling recently at hospital, but nursing states not  Restrictions Weight Bearing Restrictions: No    Mobility  Bed Mobility               General bed mobility comments: declined  Transfers                 General transfer comment: declined  Ambulation/Gait             General Gait Details: declined   Stairs            Wheelchair Mobility    Modified Rankin (Stroke Patients Only)       Balance                                    Cognition Arousal/Alertness: Awake/alert Behavior During Therapy: Anxious Overall Cognitive Status: Impaired/Different from baseline Area of Impairment: Attention;Following commands;Safety/judgement;Awareness;Problem solving   Current Attention Level: Selective   Following Commands: Follows one step commands inconsistently Safety/Judgement: Decreased awareness of deficits;Decreased awareness of safety Awareness: Intellectual Problem Solving: Slow processing;Decreased initiation;Requires verbal cues      Exercises General Exercises - Lower Extremity Ankle Circles/Pumps: AROM;Both;10 reps Quad Sets:  AROM;Both;15 reps Gluteal Sets: AROM;Both;10 reps Heel Slides: AROM;Both;10 reps Hip ABduction/ADduction: AROM;20 reps;Both;AAROM    General Comments        Pertinent Vitals/Pain Pain Assessment: Faces Faces Pain Scale: Hurts little more Pain Location: R hip and back Pain Descriptors / Indicators: Guarding Pain Intervention(s): Limited activity within patient's tolerance;Monitored during session;Premedicated before session;Repositioned    Home Living                      Prior Function            PT Goals (current goals can now be found in the care plan section) Acute Rehab PT Goals Patient Stated Goal: to get better Progress towards PT goals: Not progressing toward goals - comment (declines to get OOB)    Frequency    Min 3X/week      PT Plan Current plan remains appropriate    Co-evaluation             End of Session   Activity Tolerance: Patient tolerated treatment well Patient left: in bed;with call bell/phone within reach;with bed alarm set     Time: 1202-1230 PT Time Calculation (min) (ACUTE ONLY): 28 min  Charges:  $Therapeutic Exercise: 23-37 mins                    G Codes:  Ramond Dial 08/24/2016, 1:35 PM   Mee Hives, PT MS Acute Rehab Dept. Number: [place] [facility] [place]

## 2016-08-25 LAB — RENAL FUNCTION PANEL
ANION GAP: 5 (ref 5–15)
Albumin: 3.8 g/dL (ref 3.5–5.0)
BUN: 9 mg/dL (ref 6–20)
CALCIUM: 9 mg/dL (ref 8.9–10.3)
CO2: 27 mmol/L (ref 22–32)
Chloride: 103 mmol/L (ref 101–111)
Creatinine, Ser: 0.65 mg/dL (ref 0.44–1.00)
GFR calc Af Amer: >60 mL/min (ref >60)
GFR calc non Af Amer: >60 mL/min (ref >60)
GLUCOSE: 91 mg/dL (ref 65–99)
Phosphorus: 3.5 mg/dL (ref 2.5–4.6)
Potassium: 4.9 mmol/L (ref 3.5–5.1)
SODIUM: 135 mmol/L (ref 135–145)

## 2016-08-25 LAB — CBC
HCT: 39.4 % (ref 36.0–46.0)
Hemoglobin: 13.1 g/dL (ref 12.0–15.0)
MCH: 30.8 pg (ref 26.0–34.0)
MCHC: 33.2 g/dL (ref 30.0–36.0)
MCV: 92.7 fL (ref 78.0–100.0)
PLATELETS: 171 10*3/uL (ref 150–400)
RBC: 4.25 MIL/uL (ref 3.87–5.11)
RDW: 14.7 % (ref 11.5–15.5)
WBC: 6 10*3/uL (ref 4.0–10.5)

## 2016-08-25 MED ORDER — POLYETHYLENE GLYCOL 3350 17 G PO PACK: 17.0000 g | PACK | Freq: Every day | ORAL | Status: AC

## 2016-08-25 MED ORDER — SENNOSIDES-DOCUSATE SODIUM 8.6-50 MG PO TABS: 1.0000 | ORAL_TABLET | Freq: Two times a day (BID) | ORAL | Status: AC

## 2016-08-25 MED ORDER — IBUPROFEN 200 MG PO TABS: 400.0000 mg | ORAL_TABLET | Freq: Four times a day (QID) | ORAL | Status: AC | PRN

## 2016-08-25 MED ORDER — QUETIAPINE FUMARATE 25 MG PO TABS: 25.0000 mg | ORAL_TABLET | Freq: Every day | ORAL | Status: AC

## 2016-08-25 MED ORDER — ADULT MULTIVITAMIN W/MINERALS CH: 1.0000 | ORAL_TABLET | Freq: Every day | ORAL | Status: AC

## 2016-08-25 MED ORDER — PROMETHAZINE HCL 25 MG PO TABS: 12.5000 mg | ORAL_TABLET | Freq: Three times a day (TID) | ORAL | Status: AC | PRN

## 2016-08-25 MED ORDER — THIAMINE HCL 100 MG PO TABS: 100.0000 mg | ORAL_TABLET | Freq: Every day | ORAL | Status: AC

## 2016-08-25 MED ORDER — ACETAMINOPHEN 500 MG PO TABS: 500.0000 mg | ORAL_TABLET | Freq: Four times a day (QID) | ORAL | Status: AC | PRN

## 2016-08-25 MED ORDER — CLONAZEPAM 0.5 MG PO TABS: 0.5000 mg | ORAL_TABLET | Freq: Two times a day (BID) | ORAL | 0 refills | Status: AC | PRN

## 2016-08-25 MED ORDER — OXYCODONE HCL 5 MG PO TABS: 5.0000 mg | ORAL_TABLET | Freq: Two times a day (BID) | ORAL | 0 refills | Status: AC | PRN

## 2016-08-25 MED ORDER — FAMOTIDINE 20 MG PO TABS: 20.0000 mg | ORAL_TABLET | Freq: Two times a day (BID) | ORAL | Status: AC

## 2016-08-25 MED ORDER — FOLIC ACID 1 MG PO TABS: 1.0000 mg | ORAL_TABLET | Freq: Every day | ORAL | Status: AC

## 2016-08-25 NOTE — Clinical Social Work Placement (Signed)
   CLINICAL SOCIAL WORK PLACEMENT  NOTE  Date:  08/25/2016  Patient Details  Name: Veronica Fitzgerald MRN: [id number] Date of Birth: 06/17/1976  Clinical Social Work is seeking post-discharge placement for this patient at the [place] level of care (*CSW will initial, date and re-position this form in  chart as items are completed):  Yes   Patient/family provided with [facility] Work Department's list of facilities offering this level of care within the geographic area requested by the patient (or if unable, by the patient's family).  Yes   Patient/family informed of their freedom to choose among providers that offer the needed level of care, that participate in Medicare, Medicaid or managed care program needed by the patient, have an available bed and are willing to accept the patient.  Yes   Patient/family informed of [facility]'s ownership interest in [facility] and [facility], as well as of the fact that they are under no obligation to receive care at these facilities.  PASRR submitted to [facility] on 08/20/16     PASRR number received on 08/25/16     Existing PASRR number confirmed on       FL2 transmitted to all facilities in geographic area requested by pt/family on 08/20/16     FL2 transmitted to all facilities within larger geographic area on       Patient informed that his/her managed care company has contracts with or will negotiate with certain facilities, including the following:        Yes   Patient/family informed of bed offers received.  Patient chooses bed at [facility]     Physician recommends and patient chooses bed at      Patient to be transferred to [facility] on 08/25/16.  Patient to be transferred to facility by [facility]     Patient family notified on 08/25/16 of transfer.  Name of family member notified:  N/A     PHYSICIAN Please sign FL2     Additional Comment:     _______________________________________________ Benard Halsted, [place] 08/25/2016, 1:29 PM

## 2016-08-25 NOTE — Progress Notes (Signed)
Veronica Fitzgerald to be D/C'd Skilled nursing facility per MD order.  Discussed with the patient and all questions fully answered.  VSS, Skin clean, dry and intact without evidence of skin break down. IV catheter discontinued intact. Site without signs and symptoms of complications. Dressing and pressure applied.  Patient D/C'd by Corey Harold. Report called to RN at [facility].  [name] 08/25/2016 2:46 PM

## 2016-08-25 NOTE — Progress Notes (Signed)
Pasrr #: [id number] Veronica Fitzgerald [phone]

## 2016-08-25 NOTE — Discharge Instructions (Signed)
Confusion Introduction Confusion is the inability to think with your usual speed or clarity. Confusion may come on quickly or slowly over time. How quickly the confusion comes on depends on the cause. Confusion can be due to any number of causes. What are the causes?  Concussion, head injury, or head trauma.  Seizures.  Stroke.  Fever.  Brain tumor.  Age related decreased brain function (dementia).  Heightened emotional states like rage or terror.  Mental illness in which the person loses the ability to determine what is real and what is not (hallucinations).  Infections such as a urinary tract infection (UTI).  Toxic effects from alcohol, drugs, or prescription medicines.  Dehydration and an imbalance of salts in the body (electrolytes).  Lack of sleep.  Low blood sugar (diabetes).  Low levels of oxygen from conditions such as chronic lung disorders.  Drug interactions or other medicine side effects.  Nutritional deficiencies, especially niacin, thiamine, vitamin C, or vitamin B.  Sudden drop in body temperature (hypothermia).  Change in routine, such as when traveling or hospitalized. What are the signs or symptoms? People often describe their thinking as cloudy or unclear when they are confused. Confusion can also include feeling disoriented. That means you are unaware of where or who you are. You may also not know what the date or time is. If confused, you may also have difficulty paying attention, remembering, and making decisions. Some people also act aggressively when they are confused. How is this diagnosed? The medical evaluation of confusion may include:  Blood and urine tests.  X-rays.  Brain and nervous system tests.  Analyzing your brain waves (electroencephalogram or EEG).  Magnetic resonance imaging (MRI) of your head.  Computed tomography (CT) scan of your head.  Mental status tests in which your health care provider may ask many questions.  Some of these questions may seem silly or strange, but they are a very important test to help diagnose and treat confusion. How is this treated? An admission to the hospital may not be needed, but a person with confusion should not be left alone. Stay with a family member or friend until the confusion clears. Avoid alcohol, pain relievers, or sedative drugs until you have fully recovered. Do not drive until directed by your health care provider. Follow these instructions at home: What family and friends can do:  To find out if someone is confused, ask the person to state his or her name, age, and the date. If the person is unsure or answers incorrectly, he or she is confused.  Always introduce yourself, no matter how well the person knows you.  Often remind the person of his or her location.  Place a calendar and clock near the confused person.  Help the person with his or her medicines. You may want to use a pill box, an alarm as a reminder, or give the person each dose as prescribed.  Talk about current events and plans for the day.  Try to keep the environment calm, quiet, and peaceful.  Make sure the person keeps follow-up visits with his or her health care provider. How is this prevented? Ways to prevent confusion:  Avoid alcohol.  Eat a balanced diet.  Get enough sleep.  Take medicine only as directed by your health care provider.  Do not become isolated. Spend time with other people and make plans for your days.  Keep careful watch on your blood sugar levels if you are diabetic. Get help right away if:    You develop severe headaches, repeated vomiting, seizures, blackouts, or slurred speech.  There is increasing confusion, weakness, numbness, restlessness, or personality changes.  You develop a loss of balance, have marked dizziness, feel uncoordinated, or fall.  You have delusions, hallucinations, or develop severe anxiety.  Your family members think you need to be  rechecked. This information is not intended to replace advice given to you by your health care provider. Make sure you discuss any questions you have with your health care provider. Document Released: 09/23/2004 Document Revised: 03/05/2016 Document Reviewed: 09/21/2013  2017 [employer]  

## 2016-08-25 NOTE — Discharge Summary (Signed)
Physician Discharge Summary  Veronica Fitzgerald [id number] DOB: April 09, 1976  PCP: No PCP Per Patient  Admit date: 08/17/2016 Discharge date: 08/25/2016  Recommendations for Outpatient Follow-up:  1. M.D. at SNF in 3 days. 2. Dr. Lillia Mountain, Oncology at [facility]. Keep prior appointment. SNF to coordinate. [place]: Patient is to walk in for the first appointment. 4. SNF to assist patient with finding new PCP upon discharge from SNF. 5. Consider outpatient pain management consultation if patient continues to have persistent pain.  Home Health: None Equipment/Devices: None    Discharge Condition: Improved and stable  CODE STATUS: Full  Diet recommendation: Heart healthy diet.  Discharge Diagnoses:  Principal Problem:   Encephalopathy Active Problems:   Diffuse large B cell lymphoma (HCC)   Abdominal pain   Bipolar disorder (HCC)   GERD (gastroesophageal reflux disease)   Leukocytosis   Hypokalemia   Anxiety   Brief/Interim Summary: 40 y/o F with PMHx of lymphoma in remission, presented to the ED via EMS due to alter mental status. Patient was on a bus ride from [place] to [place] when she was noted to be combative and confused. On arrival to the ED she was found to be encephalopathic. Multiple bottle of sedating medications were found, Zanaflex, Phenergan, benadryl and HCTZ. Apparently patient had visited [facility] prior to come to [facility], but no records available. Patient was admitted for AMS which has resolved and abdominal pain.   Assessment & Plan:   Acute Encephalopathy Likely due to sedating agents - Benadryl, Phenergan, Zanaflex CT head negative  No medical records available - despite patient been in health institutions that use epic - Patient is not found in care every where. Several inconsistencies in her history to several physicians. EEG consistent with sedative medications  PT recommended SNF  due generalize weakness - patient continues to not be able to ambulate well versus? Not willing to ambulate.    Abdominal pain 2/2 to constipation - having regular bowel movement now, but pain continues No signs of acute abdomen  UA negative - urine culture contaminant  KUB show some stools - non obstructive changes  LFTs and Lipase levels normal  Continue bowel regimen - Senna and Miralax. Patient had last BM on night prior to discharge.  On regular diet and has eaten 100% of her breakfast this morning. Reported abdominal pain out of proportion to physical findings. The history of her abdominal pain also doesn't appear to be consistent. Discussed in detail with patient in the presence of her RN and advised her that in the absence of clear source of pain, it may not be prudent to continue opioids long-term and hence these will be rapidly tapered to discontinue.  Constipation - likely opioid induced - Improved  - Continue bowel regimen. Having BMs now.  Diffuse B Cell Lymphoma Stage 3 (per patient poor prognosis s/p chemotherapy in 2015, she still has a R chest port - in remission) - CT Abd/Pelvis no lymphadenopathy Sees Dr Sedonia Small at [facility] substance reporting system only on Fentanyl back in September with no refills  As per review of her records by colleague hospitalist on 12/26: Records of [facility] reviewed, last seen on 07/07/16, increasing anxiety due to thoughts of recurrent cancer. PET scan 07/16/16 was negative. Source of abdominal pain thought to be related to anxiety and PCP and patient had agreed and discontinued opiate medications including oxycodone, OxyContin and fentanyl.  Consider outpatient pain management consultation.  Adjustment disorder with depressed mood/rule out MDD with mixed features/rule out bipolar 2 disorder  Psych recommendations appreciated. As per psychiatry evaluation on 12/25, exam was notable for occasional tangentiality and  inconsistent story. No delirium noted. Psychiatrist's recommended starting low-dose quetiapine 25 MG at bedtime to target her mood, insomnia and hypomanic symptoms. Outpatient psychiatry consultation and follow-up was recommended. Also recommended switching from Xanax to Ativan or Klonopin to avoid risk of dependence with patient's history of alcohol use. Continue Seroquel 25 MG daily at bedtime and clonazepam 0.5 MG twice a day when necessary for anxiety. Clinical social worker has provided patient with resources for outpatient psychiatry follow-up.  Psych cleared for discharge  Patient with no SI/HI and no psychotic symptoms  - Please refer to psychiatry consultation note for full details.  Lekocytosis - resolved   Hypokalemia/Hypomag/hypophosphatemia  - Resolved   Normocytic Anemia  Likely due to hemodilution - patient had aggressive hydration Hemoglobin has normalized.  Transient thrombocytopenia - Unclear etiology. Resolved.   GERD - Continue Pepcid. DC sucralfate at discharge.  ? Essential hypertension - HCTZ listed on home meds but unsure when she last took them. Currently controlled off of medications. Monitor and reassess at SNF regarding need for antihypertensives.   Alcohol use history - No withdrawal symptoms noted. Continue vitamin supplements. Abstinence was counseled.   Patient gave inconsistent reports and was asking for more oxy, when told that has not been prescribed in the last 6 month she reported that it was given to her in a hospital in [place]. Also asking to use her chemo port instead of peripheral vein. When she reportedly been in remission and no chemotherapy, but when told that is not safe to access the port without knowing if is functional she stated "I had chemo 2 weeks ago with Dr. Sedonia Small" again no records available. She was also inconsistent with psych evaluation.     Consultants:   Psychiatry  Procedures:  EEG 12/20 Impression: This  awake and drowsyEEG is normal except for excess amount of diffuse low voltage beta activity.  Clinical Correlation: Diffuse low voltage beta activity is commonly seen with sedating medications such as benzodiazepines. In the absence of sedating medications, anxiety and hyperthyroidism may produce generalized beta activity. The absence of epileptiform discharges does not exclude a clinical diagnosis of epilepsy. If further clinical questions remain, prolonged EEG may be helpful. Clinical correlation is advised.  Discharge Instructions  Discharge Instructions    Call MD for:  difficulty breathing, headache or visual disturbances    Complete by:  As directed    Call MD for:  extreme fatigue    Complete by:  As directed    Call MD for:  persistant dizziness or light-headedness    Complete by:  As directed    Call MD for:  persistant nausea and vomiting    Complete by:  As directed    Call MD for:  severe uncontrolled pain    Complete by:  As directed    Call MD for:  temperature >100.4    Complete by:  As directed    Diet - low sodium heart healthy    Complete by:  As directed    Increase activity slowly    Complete by:  As directed        Medication List    STOP taking these medications   hydrochlorothiazide 25 MG tablet Commonly known as:  HYDRODIURIL   tiZANidine 4 MG tablet Commonly known  as:  ZANAFLEX   zolpidem 12.5 MG CR tablet Commonly known as:  AMBIEN CR     TAKE these medications   acetaminophen 500 MG tablet Commonly known as:  TYLENOL Take 1-2 tablets (500-1,000 mg total) by mouth every 6 (six) hours as needed for mild pain. What changed:  how much to take   albuterol 108 (90 Base) MCG/ACT inhaler Commonly known as:  PROVENTIL HFA;VENTOLIN HFA Inhale 2 puffs into the lungs every 6 (six) hours as needed for wheezing or shortness of breath.   clonazePAM 0.5 MG tablet Commonly known as:  KLONOPIN Take 1 tablet (0.5 mg total) by mouth 2 (two) times daily  as needed for anxiety.   EPIPEN 2-PAK 0.3 mg/0.3 mL Soaj injection Generic drug:  EPINEPHrine Inject 0.3 mg into the muscle daily as needed. For allergic reaction   famotidine 20 MG tablet Commonly known as:  PEPCID Take 1 tablet (20 mg total) by mouth 2 (two) times daily.   folic acid 1 MG tablet Commonly known as:  FOLVITE Take 1 tablet (1 mg total) by mouth daily. Start taking on:  08/26/2016   ibuprofen 200 MG tablet Commonly known as:  ADVIL,MOTRIN Take 2 tablets (400 mg total) by mouth every 6 (six) hours as needed for fever, headache or moderate pain. What changed:  reasons to take this   multivitamin with minerals Tabs tablet Take 1 tablet by mouth daily. Start taking on:  08/26/2016   oxyCODONE 5 MG immediate release tablet Commonly known as:  Oxy IR/ROXICODONE Take 1 tablet (5 mg total) by mouth every 12 (twelve) hours as needed for severe pain.   polyethylene glycol packet Commonly known as:  MIRALAX / GLYCOLAX Take 17 g by mouth daily. Start taking on:  08/26/2016   promethazine 25 MG tablet Commonly known as:  PHENERGAN Take 0.5 tablets (12.5 mg total) by mouth every 8 (eight) hours as needed for nausea or vomiting. What changed:  how much to take  when to take this  reasons to take this   QUEtiapine 25 MG tablet Commonly known as:  SEROQUEL Take 1 tablet (25 mg total) by mouth at bedtime.   senna-docusate 8.6-50 MG tablet Commonly known as:  Senokot-S Take 1 tablet by mouth 2 (two) times daily.   SUMAtriptan 100 MG tablet Commonly known as:  IMITREX Take 100 mg by mouth every 2 (two) hours as needed for migraine. May repeat in 2 hours if headache persists or recurs.   thiamine 100 MG tablet Take 1 tablet (100 mg total) by mouth daily. Start taking on:  08/26/2016       Contact information for follow-up [place]. Go to.   Specialty:  Professional Counselor Why:  Please walk in for the first  appointment. Bring your hospital discharge information. Contact information: [facility] [place] [address] [phone]        M.D. at SNF. Schedule an appointment as soon as possible for a visit in 3 day(s).        Lillia Mountain, MD Follow up.   Specialty:  Hematology and Oncology Why:  Patient is advised to keep prior appointment. Contact information: [place] [address] [phone]            Contact information for after-discharge care    Destination    [facility] Follow up.   Specialty:  [place]  Contact information: [place] [phone]                 Allergies  Allergen Reactions  . Tramadol Hives    Procedures/Studies: Ct Head Wo Contrast  Result Date: 08/17/2016 CLINICAL DATA:  Altered mental status EXAM: CT HEAD WITHOUT CONTRAST TECHNIQUE: Contiguous axial images were obtained from the base of the skull through the vertex without intravenous contrast. COMPARISON:  None. FINDINGS: Brain: No evidence of acute infarction, hemorrhage, hydrocephalus, extra-axial collection or mass lesion/mass effect. Vascular: No hyperdense vessel or unexpected calcification. Skull: Negative Sinuses/Orbits: Negative Other: None IMPRESSION: Normal CT head Electronically Signed   By: Franchot Gallo M.D.   On: 08/17/2016 16:43   Ct Abdomen Pelvis W Contrast  Result Date: 08/19/2016 CLINICAL DATA:  Lower abdominal and right groin pain in a patient with history of pancreatitis. EXAM: CT ABDOMEN AND PELVIS WITH CONTRAST TECHNIQUE: Multidetector CT imaging of the abdomen and pelvis was performed using the standard protocol following bolus administration of intravenous contrast. CONTRAST:  100 ml ISOVUE-300 IOPAMIDOL (ISOVUE-300) INJECTION 61% COMPARISON:  None. FINDINGS: Lower chest: Lung bases clear.  No pleural or  pericardial effusion. Hepatobiliary: Status post cholecystectomy. Liver and biliary tree are unremarkable. Pancreas: Unremarkable. No pancreatic ductal dilatation or surrounding inflammatory changes. Spleen: Normal in size without focal abnormality. Adrenals/Urinary Tract: 1 cm cyst lower pole right kidney is noted. The kidneys are otherwise unremarkable. The urinary bladder is distended but otherwise unremarkable. The adrenal glands appear normal. Stomach/Bowel: Stomach is within normal limits. Appendix appears normal. No evidence of bowel wall thickening, distention, or inflammatory changes. Prominent stool ascending colon noted. Vascular/Lymphatic: No atherosclerotic vascular disease is seen. No lymphadenopathy. Retroaortic left renal vein is incidentally noted. Reproductive: Status post hysterectomy. No adnexal masses. Other: No abdominal wall hernia or abnormality. No abdominopelvic ascites. Musculoskeletal: Status post L4-S1 fusion. No lytic or sclerotic lesion. IMPRESSION: No acute abnormality or finding to explain the patient's symptoms. Electronically Signed   By: Inge Rise M.D.   On: 08/19/2016 16:17   Dg Abd Portable 1v  Result Date: 08/18/2016 CLINICAL DATA:  Abdominal pain EXAM: PORTABLE ABDOMEN - 1 VIEW COMPARISON:  None. FINDINGS: Scattered large and small bowel gas is noted. No obstructive changes are seen. Mild constipation is noted. Postsurgical changes in the lower lumbar spine are seen. No acute bony abnormality noted. No free air is seen. IMPRESSION: Mild constipation.  No acute abnormality is noted. Electronically Signed   By: Inez Catalina M.D.   On: 08/18/2016 13:06      Subjective: Ongoing issues with abdominal pain-reports are inconsistent. At times she states pain is in the right groin, then she states that pain is underneath both ribs. Tolerating diet (witnessed breakfast plate empty and has eaten 100% breakfast). Had BM last night. No nausea or vomiting reported. Also  had questions about her anxiety medications.  Discharge Exam:  Vitals:   08/24/16 0633 08/24/16 1519 08/24/16 2055 08/25/16 0533  BP: 92/68 117/80 (!) 141/73 110/75  Pulse: 86 98 (!) 126 90  Resp: 16 20  18   Temp: 97.5 F (36.4 C) 98.2 F (36.8 C)  98.1 F (36.7 C)  TempSrc: Oral Oral  Oral  SpO2: 100% 100% 100% 99%  Weight:      Height:        General: Pt lying comfortably in bed & appears in no obvious distress. Cardiovascular: S1 & S2 heard, RRR, S1/S2 +. No murmurs, rubs, gallops or clicks.  No JVD or pedal edema. Telemetry: Mostly sinus rhythm. Occasionally mild sinus tachycardia. Respiratory: Clear to auscultation without wheezing, rhonchi or crackles. No increased work of breathing. Port-A-Cath right upper anterior chest. Site looks clean and intact. Abdominal:  Non distended & soft. No organomegaly or masses appreciated. Normal bowel sounds heard. Inconsistent and nonreproducible abdominal tenderness. CNS: Alert and oriented. No focal deficits. Extremities: no edema, no cyanosis    The results of significant diagnostics from this hospitalization (including imaging, microbiology, ancillary and laboratory) are listed below for reference.     Microbiology: Recent Results (from the past 240 hour(s))  Culture, Urine     Status: Abnormal   Collection Time: 08/20/16  3:30 PM  Result Value Ref Range Status   Specimen Description URINE, RANDOM  Final   Special Requests NONE  Final   Culture MULTIPLE SPECIES PRESENT, SUGGEST RECOLLECTION (A)  Final   Report Status 08/21/2016 FINAL  Final     Labs: BNP (last 3 results) No results for input(s): BNP in the last 8760 hours. Basic Metabolic Panel:  Recent Labs Lab 08/19/16 1229 08/20/16 0620 08/21/16 0601 08/22/16 0446 08/25/16 0728  NA 137 135 137 136 135  K 3.2* 3.6 3.1* 3.7 4.9  CL 103 104 107 108 103  CO2 23 23 27 24 27   GLUCOSE 92 119* 121* 109* 91  BUN 7 10 9 6 9   CREATININE 0.80 0.84 0.78 0.54 0.65  CALCIUM  9.8 9.2 7.7* 7.9* 9.0  MG 1.7 1.7 1.6* 1.8  --   PHOS 6.0* 3.3 1.5*  --  3.5   Liver Function Tests:  Recent Labs Lab 08/19/16 1229 08/20/16 0620 08/21/16 0601 08/25/16 0728  AST 31 30 21   --   ALT 28 27 18   --   ALKPHOS 62 55 57  --   BILITOT 1.6* 1.1 0.2*  --   PROT 7.9 7.7 5.7*  --   ALBUMIN 4.4 4.2 3.1* 3.8   CBC:  Recent Labs Lab 08/19/16 1229 08/20/16 0620 08/21/16 0601 08/22/16 0446 08/25/16 0728  WBC 12.4* 5.4 4.5 4.1 6.0  NEUTROABS 9.6* 3.1 2.2  --   --   HGB 13.7 13.2 10.4* 10.4* 13.1  HCT 39.8 40.4 31.9* 31.5* 39.4  MCV 90.0 93.3 91.7 92.4 92.7  PLT 175 150 127* 118* 171   Urinalysis    Component Value Date/Time   COLORURINE YELLOW 08/20/2016 1530   APPEARANCEUR CLEAR 08/20/2016 1530   LABSPEC 1.016 08/20/2016 1530   PHURINE 5.0 08/20/2016 [place] 08/20/2016 1530   HGBUR NEGATIVE 08/20/2016 1530   BILIRUBINUR NEGATIVE 08/20/2016 [place] 08/20/2016 1530   PROTEINUR NEGATIVE 08/20/2016 1530   NITRITE NEGATIVE 08/20/2016 [place] 08/20/2016 1530      Time coordinating discharge: Over 30 minutes  SIGNED:  Vernell Leep, MD, FACP, FHM. [facility] Pager [phone]  If 7PM-7AM, please contact night-coverage [email or web address] Password [facility] 08/25/2016, 12:41 PM

## 2016-08-25 NOTE — Progress Notes (Signed)
Patient will DC to: [facility] date: 08/25/16 Family notified: N/A Transport by: Corey Harold 2:30pm   Per MD patient ready for DC to [facility]. RN, patient, patient's family, and facility notified of DC. Discharge Summary sent to facility. RN given number for report. DC packet on chart. Ambulance transport requested for patient.   CSW signing off.  Cedric Fishman, [place] Social Worker [phone]

## 2018-06-16 IMAGING — CT CT ABD-PELV W/ CM
2 of 5 series · 16 of 46 positions shown, 18 images · IV contrast (Omni 300)
Comparison: None.

CLINICAL DATA: Lower abdominal and right groin pain in a patient
with history of pancreatitis.

EXAM:
CT ABDOMEN AND PELVIS WITH CONTRAST
TECHNIQUE: Multidetector CT imaging of the abdomen and pelvis was performed
using the standard protocol following bolus administration of
intravenous contrast.
CONTRAST:  100 ml [id number]-3KK IOPAMIDOL (67SY1O-3KK) INJECTION 61%

[Series 2: a/p w/ 5mm · axial · 0.74mm/px · z∈[+711,+1111]mm · 13 of 90 slices shown, 15 images]
[im 5/90  soft-tissue]
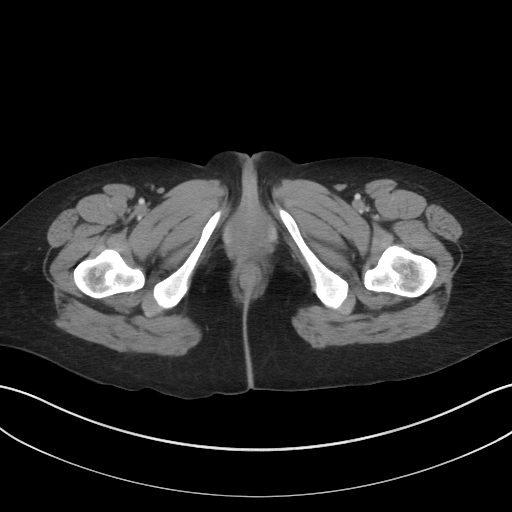
[im 5/90  bone]
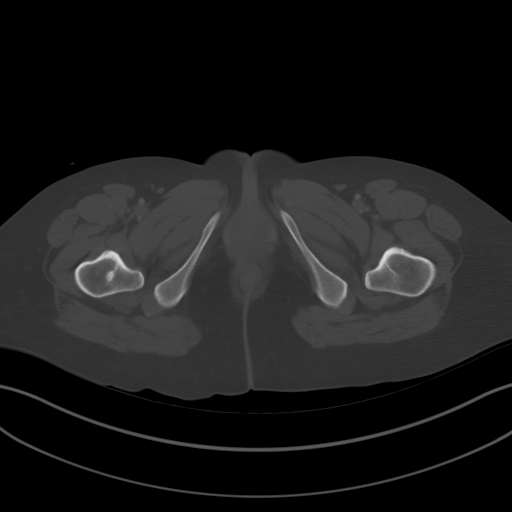
[im 10/90  soft-tissue]
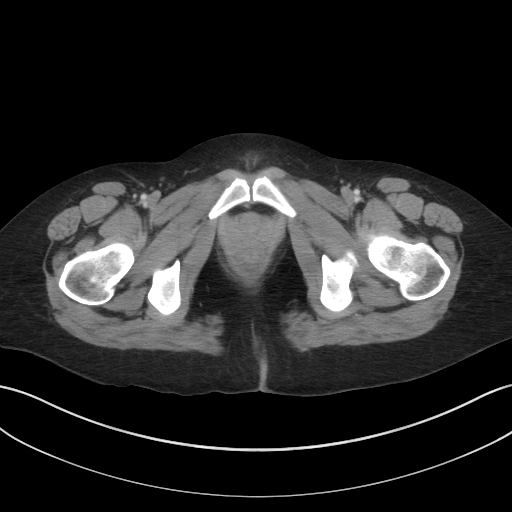
[im 20/90  soft-tissue]
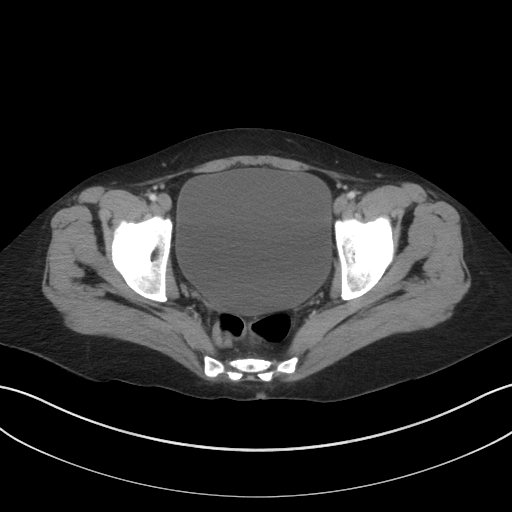
[im 25/90  soft-tissue]
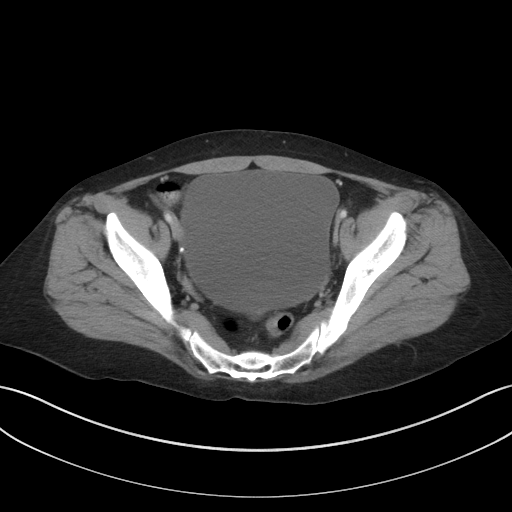
[im 30/90  soft-tissue]
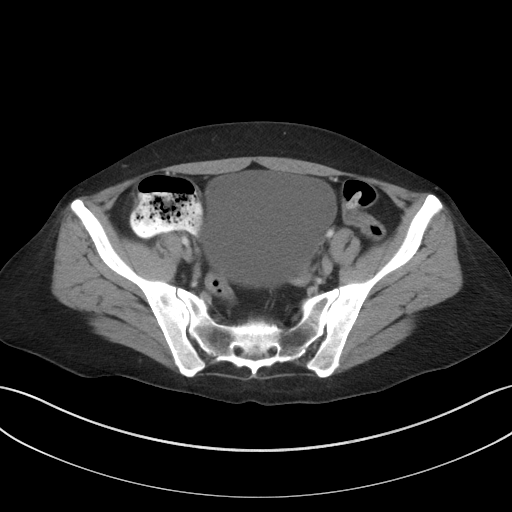
[im 40/90  soft-tissue]
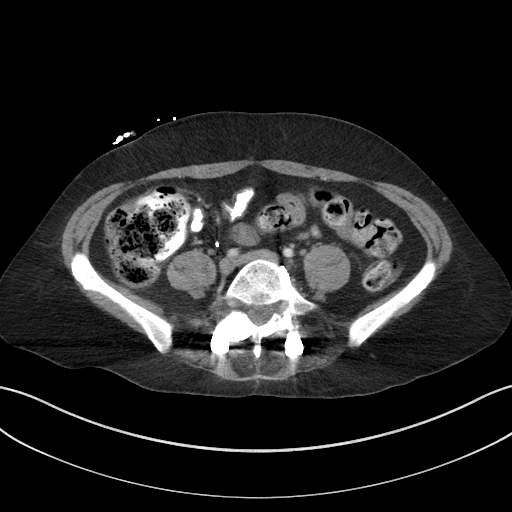
[im 45/90  soft-tissue]
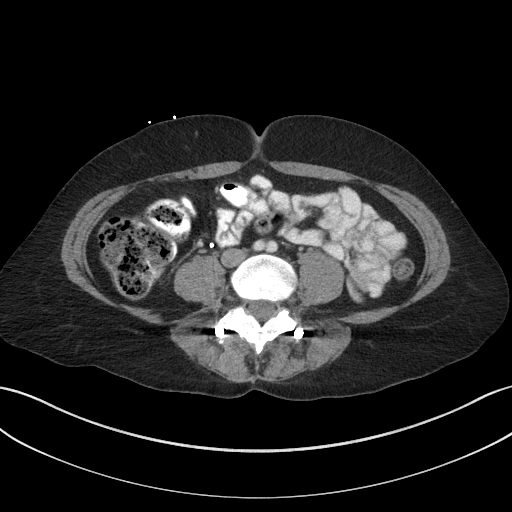
[im 50/90  soft-tissue]
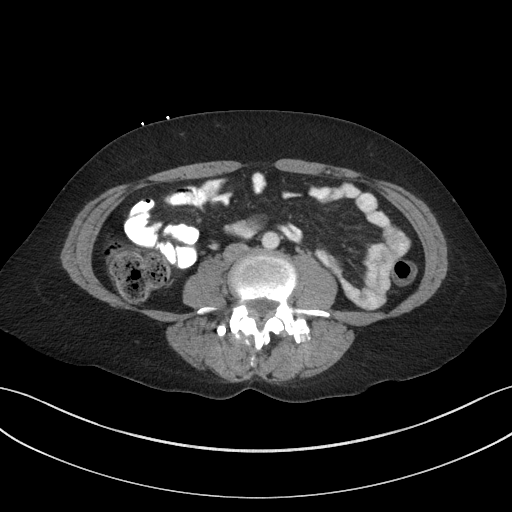
[im 60/90  soft-tissue]
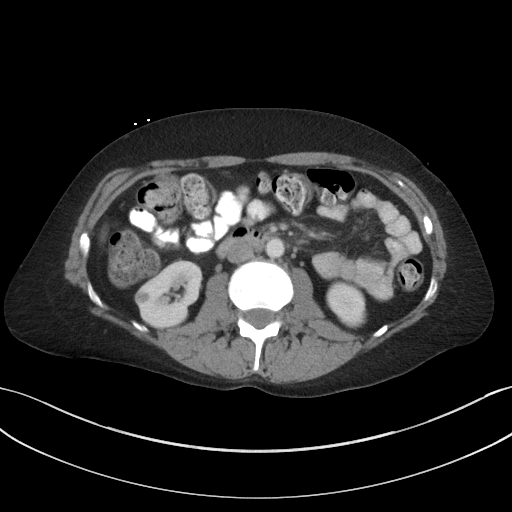
[im 60/90  bone]
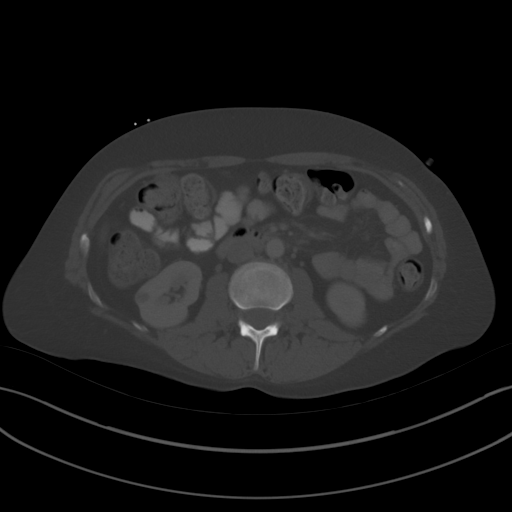
[im 65/90  soft-tissue]
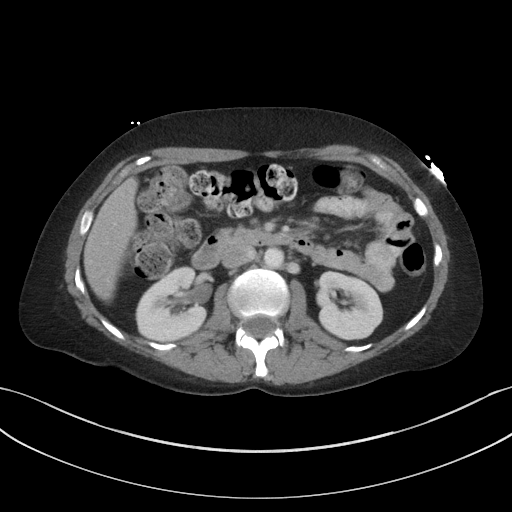
[im 70/90  soft-tissue]
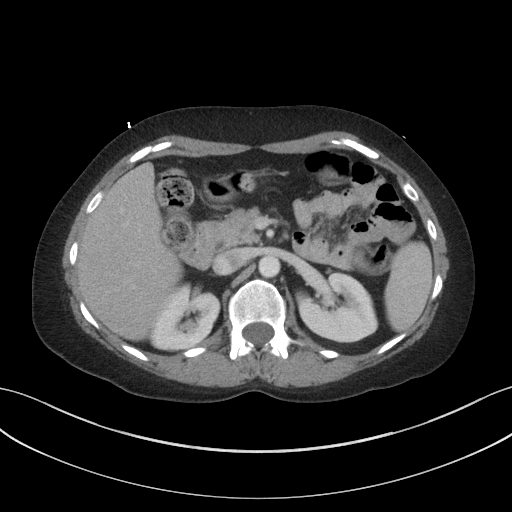
[im 80/90  soft-tissue]
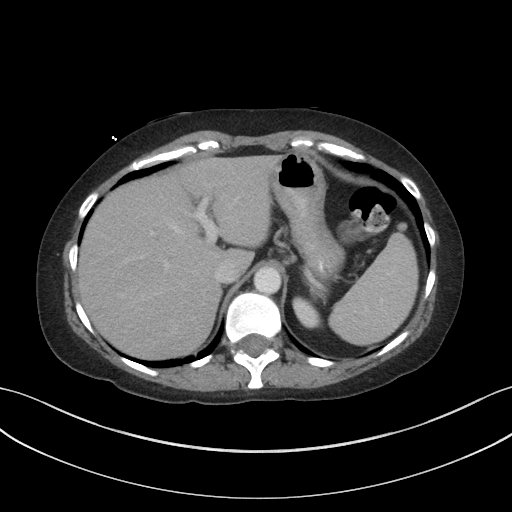
[im 85/90  soft-tissue]
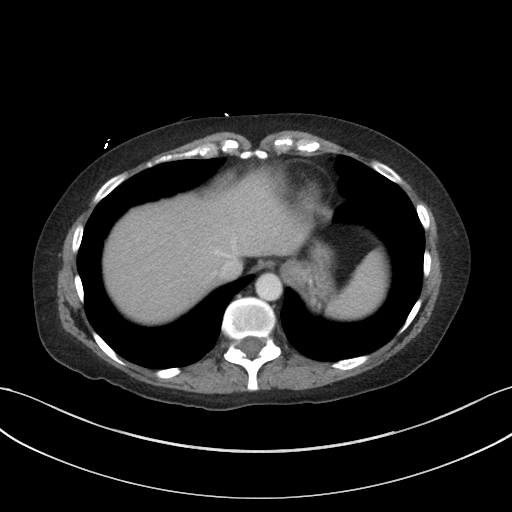

[Series 5: a/p w/ cor · coronal · 0.64mm/px · 3 of 112 slices shown]
[im 38/112  soft-tissue]
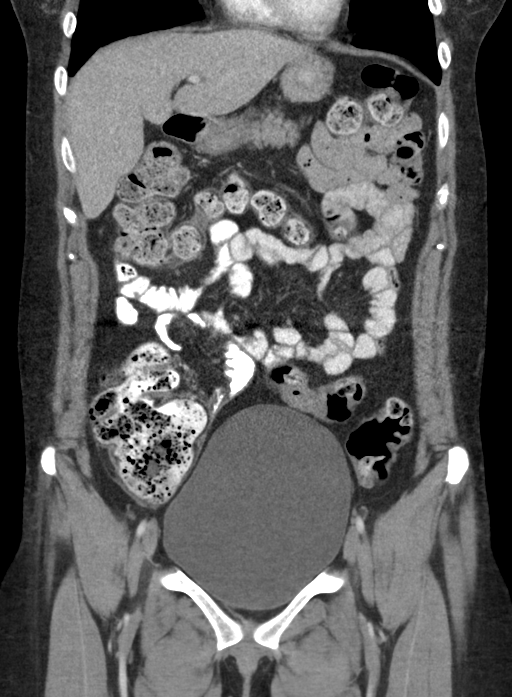
[im 50/112  soft-tissue]
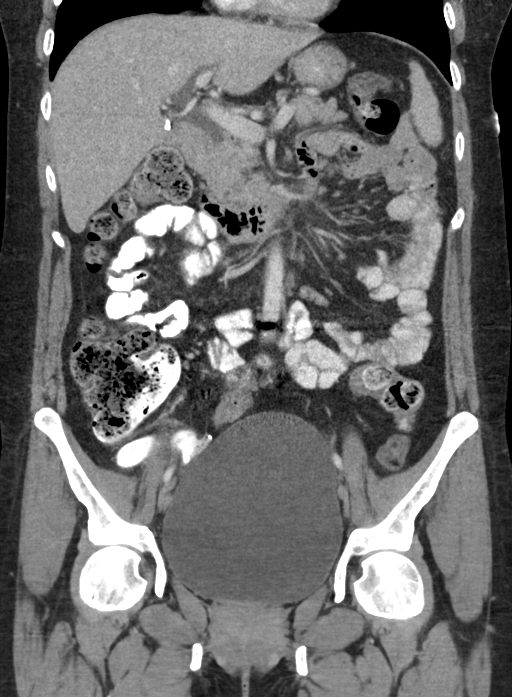
[im 62/112  soft-tissue]
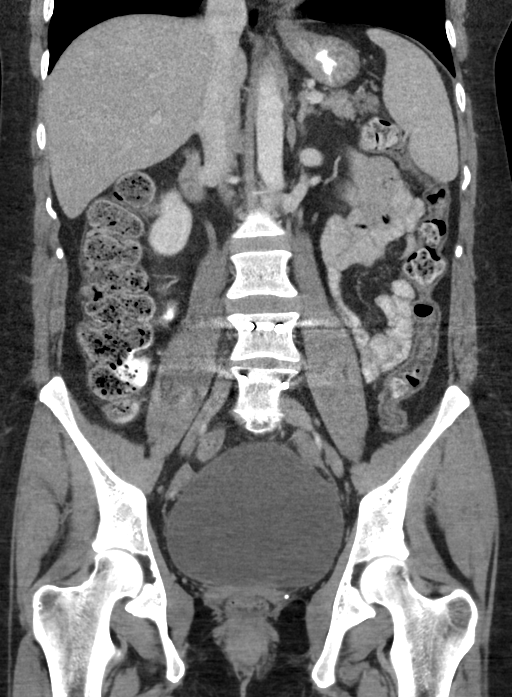

[16 of 46 positions shown; findings below may reference images not displayed]

FINDINGS: Lower chest: Lung bases clear.  No pleural or pericardial effusion.

Hepatobiliary: Status post cholecystectomy. Liver and biliary tree
are unremarkable.

Pancreas: Unremarkable. No pancreatic ductal dilatation or
surrounding inflammatory changes.

Spleen: Normal in size without focal abnormality.

Adrenals/Urinary Tract: 1 cm cyst lower pole right kidney is noted.
The kidneys are otherwise unremarkable. The urinary bladder is
distended but otherwise unremarkable. The adrenal glands appear
normal.

Stomach/Bowel: Stomach is within normal limits. Appendix appears
normal. No evidence of bowel wall thickening, distention, or
inflammatory changes. Prominent stool ascending colon noted.

Vascular/Lymphatic: No atherosclerotic vascular disease is seen. No
lymphadenopathy. Retroaortic left renal vein is incidentally noted.

Reproductive: Status post hysterectomy. No adnexal masses.

Other: No abdominal wall hernia or abnormality. No abdominopelvic
ascites.

Musculoskeletal: Status post L4-S1 fusion. No lytic or sclerotic
lesion.
IMPRESSION: No acute abnormality or finding to explain the patient's symptoms.

## 2021-06-30 DEATH — deceased
